# Patient Record
Sex: Male | Born: 1975 | Race: White | Hispanic: No | Marital: Single | Smoking: Current every day smoker
Health system: Southern US, Community
[De-identification: ages and names within clinical notes are randomized; demographics above are authoritative.]

## PROBLEM LIST (undated history)

## (undated) DIAGNOSIS — I10 Essential (primary) hypertension: Secondary | ICD-10-CM

## (undated) HISTORY — PX: HERNIA REPAIR: SHX51

---

## 1999-12-13 ENCOUNTER — Emergency Department (HOSPITAL_COMMUNITY): Admission: EM | Admit: 1999-12-13 | Discharge: 1999-12-13 | Payer: Self-pay | Admitting: Emergency Medicine

## 2000-09-30 ENCOUNTER — Emergency Department (HOSPITAL_COMMUNITY): Admission: EM | Admit: 2000-09-30 | Discharge: 2000-10-01 | Payer: Self-pay | Admitting: Emergency Medicine

## 2000-10-01 ENCOUNTER — Encounter: Payer: Self-pay | Admitting: Emergency Medicine

## 2000-10-16 ENCOUNTER — Encounter: Payer: Self-pay | Admitting: Chiropractor

## 2000-10-16 ENCOUNTER — Ambulatory Visit (HOSPITAL_COMMUNITY): Admission: RE | Admit: 2000-10-16 | Discharge: 2000-10-16 | Payer: Self-pay | Admitting: Chiropractor

## 2004-05-23 ENCOUNTER — Emergency Department (HOSPITAL_COMMUNITY): Admission: EM | Admit: 2004-05-23 | Discharge: 2004-05-23 | Payer: Self-pay | Admitting: Emergency Medicine

## 2015-02-11 ENCOUNTER — Encounter (HOSPITAL_COMMUNITY): Payer: Self-pay | Admitting: Emergency Medicine

## 2015-02-11 ENCOUNTER — Emergency Department (HOSPITAL_COMMUNITY)
Admission: EM | Admit: 2015-02-11 | Discharge: 2015-02-11 | Disposition: A | Discharge status: Home / Self Care | Payer: Self-pay | Attending: Emergency Medicine | Admitting: Emergency Medicine

## 2015-02-11 ENCOUNTER — Emergency Department (HOSPITAL_COMMUNITY): Payer: Self-pay

## 2015-02-11 DIAGNOSIS — Z720 Tobacco use: Secondary | ICD-10-CM | POA: Insufficient documentation

## 2015-02-11 DIAGNOSIS — L03818 Cellulitis of other sites: Secondary | ICD-10-CM

## 2015-02-11 DIAGNOSIS — Z7982 Long term (current) use of aspirin: Secondary | ICD-10-CM | POA: Insufficient documentation

## 2015-02-11 DIAGNOSIS — R079 Chest pain, unspecified: Secondary | ICD-10-CM | POA: Insufficient documentation

## 2015-02-11 DIAGNOSIS — L03312 Cellulitis of back [any part except buttock]: Secondary | ICD-10-CM | POA: Insufficient documentation

## 2015-02-11 LAB — URINALYSIS, ROUTINE W REFLEX MICROSCOPIC
Bilirubin Urine: NEGATIVE
Glucose, UA: NEGATIVE mg/dL
Hgb urine dipstick: NEGATIVE
Ketones, ur: NEGATIVE mg/dL
LEUKOCYTES UA: NEGATIVE
Nitrite: NEGATIVE
PROTEIN: NEGATIVE mg/dL
SPECIFIC GRAVITY, URINE: 1.016 (ref 1.005–1.030)
UROBILINOGEN UA: 1 mg/dL (ref 0.0–1.0)
pH: 5.5 (ref 5.0–8.0)

## 2015-02-11 LAB — COMPREHENSIVE METABOLIC PANEL
ALK PHOS: 74 U/L (ref 38–126)
ALT: 15 U/L — AB (ref 17–63)
ANION GAP: 14 (ref 5–15)
AST: 21 U/L (ref 15–41)
Albumin: 3.7 g/dL (ref 3.5–5.0)
BUN: 11 mg/dL (ref 6–20)
CALCIUM: 9.1 mg/dL (ref 8.9–10.3)
CHLORIDE: 99 mmol/L — AB (ref 101–111)
CO2: 22 mmol/L (ref 22–32)
CREATININE: 1.11 mg/dL (ref 0.61–1.24)
Glucose, Bld: 107 mg/dL — ABNORMAL HIGH (ref 65–99)
Potassium: 3.9 mmol/L (ref 3.5–5.1)
SODIUM: 135 mmol/L (ref 135–145)
Total Bilirubin: 0.6 mg/dL (ref 0.3–1.2)
Total Protein: 6.9 g/dL (ref 6.5–8.1)

## 2015-02-11 LAB — I-STAT TROPONIN, ED: TROPONIN I, POC: 0 ng/mL (ref 0.00–0.08)

## 2015-02-11 LAB — CBC
HEMATOCRIT: 41.2 % (ref 39.0–52.0)
Hemoglobin: 14.6 g/dL (ref 13.0–17.0)
MCH: 34.2 pg — ABNORMAL HIGH (ref 26.0–34.0)
MCHC: 35.4 g/dL (ref 30.0–36.0)
MCV: 96.5 fL (ref 78.0–100.0)
PLATELETS: 238 10*3/uL (ref 150–400)
RBC: 4.27 MIL/uL (ref 4.22–5.81)
RDW: 12.4 % (ref 11.5–15.5)
WBC: 10.9 10*3/uL — ABNORMAL HIGH (ref 4.0–10.5)

## 2015-02-11 MED ORDER — LIDOCAINE-EPINEPHRINE 1 %-1:100000 IJ SOLN
10.0000 mL | Freq: Once | INTRAMUSCULAR | Status: AC
  Administered 2015-02-11: 10 mL via INTRADERMAL
  Filled 2015-02-11: qty 1

## 2015-02-11 MED ORDER — SULFAMETHOXAZOLE-TRIMETHOPRIM 800-160 MG PO TABS: 1.0000 | ORAL_TABLET | Freq: Two times a day (BID) | ORAL | Status: AC

## 2015-02-11 NOTE — Discharge Instructions (Signed)
Please take your antibiotics as directed. Take all of your antibiotics, do not save or share them. Please follow-up with your doctor for reevaluation as needed. Return to ED for worsening symptoms.  Cellulitis Cellulitis is an infection of the skin and the tissue beneath it. The infected area is usually red and tender. Cellulitis occurs most often in the arms and lower legs.  CAUSES  Cellulitis is caused by bacteria that enter the skin through cracks or cuts in the skin. The most common types of bacteria that cause cellulitis are staphylococci and streptococci. SIGNS AND SYMPTOMS   Redness and warmth.  Swelling.  Tenderness or pain.  Fever. DIAGNOSIS  Your health care provider can usually determine what is wrong based on a physical exam. Blood tests may also be done. TREATMENT  Treatment usually involves taking an antibiotic medicine. HOME CARE INSTRUCTIONS   Take your antibiotic medicine as directed by your health care provider. Finish the antibiotic even if you start to feel better.  Keep the infected arm or leg elevated to reduce swelling.  Apply a warm cloth to the affected area up to 4 times per day to relieve pain.  Take medicines only as directed by your health care provider.  Keep all follow-up visits as directed by your health care provider. SEEK MEDICAL CARE IF:   You notice red streaks coming from the infected area.  Your red area gets larger or turns dark in color.  Your bone or joint underneath the infected area becomes painful after the skin has healed.  Your infection returns in the same area or another area.  You notice a swollen bump in the infected area.  You develop new symptoms.  You have a fever. SEEK IMMEDIATE MEDICAL CARE IF:   You feel very sleepy.  You develop vomiting or diarrhea.  You have a general ill feeling (malaise) with muscle aches and pains. MAKE SURE YOU:   Understand these instructions.  Will watch your condition.  Will  get help right away if you are not doing well or get worse. Document Released: 03/19/2005 Document Revised: 10/24/2013 Document Reviewed: 08/25/2011 [facility] Patient Information 2015 [place], [place]. This information is not intended to replace advice given to you by your health care provider. Make sure you discuss any questions you have with your health care provider.  Community-Associated MRSA CA-MRSA stands for community-associated methicillin-resistant Staphylococcus aureus. MRSA is a type of bacteria that is resistant to some common antibiotics. It can cause infections in the skin and many other places in the body. Staphylococcus aureus, often called "staph," is a bacteria that normally lives on the skin or in the nose. Staph on the surface of the skin or in the nose does not cause problems. However, if the staph enters the body through a cut, wound, or break in the skin, an infection can happen. Up until recently, infections with the MRSA type of staph mainly occurred in hospitals and other health care settings. There are now increasing problems with MRSA infections in the community as well. Infections with MRSA may be very serious or even life threatening. CA-MRSA is becoming more common. It is known to spread in crowded settings, in jails and prisons, and in situations where there is close skin-to-skin contact, such as during sporting events or in locker rooms. MRSA can be spread through shared items, such as children's toys, razors, towels, or sports equipment.  CAUSES All staph, including MRSA, are normally harmless unless they enter the body through a scratch, cut,  or wound, such as with surgery. All staph, including MRSA, can be spread from person-to-person by touching contaminated objects or through direct contact.  MRSA now causes illness in people who have not been in hospitals or other health care facilities. Cases of MRSA diseases in the community have been associated with:  Recent  antibiotic use.  Sharing contaminated towels or clothes.  Having active skin diseases.  Participating in contact sports.  Living in crowded settings.  Intravenous (IV) drug use.  Community-associated MRSA infections are usually skin infections, but may cause other severe illnesses.  Staph bacteria are one of the most common causes of skin infection. However, they are also a common cause of pneumonia, bone or joint infections, and bloodstream infections. DIAGNOSIS Diagnosis of MRSA is done by cultures of fluid samples that may come from:  Swabs taken from cuts or wounds in infected areas.  Nasal swabs.  Saliva or deep cough specimens from the lungs (sputum).  Urine.  Blood. Many people are "colonized" with MRSA but have no signs of infection. This means that people carry the MRSA germ on their skin or in their nose and may never develop MRSA infection.  TREATMENT  Treatment varies and is based on how serious, how deep, or how extensive the infection is. For example:  Some skin infections, such as a small boil or abscess, may be treated by draining yellowish-white fluid (pus) from the site of the infection.  Deeper or more widespread soft tissue infections are usually treated with surgery to drain pus and with antibiotic medicine given by vein or by mouth. This may be recommended even if you are pregnant.  Serious infections may require a hospital stay. If antibiotics are given, they may be needed for several weeks. PREVENTION Because many people are colonized with staph, including MRSA, preventing the spread of the bacteria from person-to-person is most important. The best way to prevent the spread of bacteria and other germs is through proper hand washing or by using alcohol-based hand disinfectants. The following are other ways to help prevent MRSA infection within community settings.   Wash your hands frequently with soap and water for at least 15 seconds. Otherwise, use  alcohol-based hand disinfectants when soap and water is not available.  Make sure people who live with you wash their hands often, too.  Do not share personal items. For example, avoid sharing razors and other personal hygiene items, towels, clothing, and athletic equipment.  Wash and dry your clothes and bedding at the warmest temperatures recommended on the labels.  Keep wounds covered. Pus from infected sores may contain MRSA and other bacteria. Keep cuts and abrasions clean and covered with germ-free (sterile), dry bandages until they are healed.  If you have a wound that appears infected, ask your caregiver if a culture for MRSA and other bacteria should be done.  If you are breastfeeding, talk to your caregiver about MRSA. You may be asked to temporarily stop breastfeeding. HOME CARE INSTRUCTIONS   Take your antibiotics as directed. Finish them even if you start to feel better.  Avoid close contact with those around you as much as possible. Do not use towels, razors, toothbrushes, bedding, or other items that will be used by others.  To fight the infection, follow your caregiver's instructions for wound care. Wash your hands before and after changing your bandages.  If you have an intravascular device, such as a catheter, make sure you know how to care for it.  Be sure to  tell any health care providers that you have MRSA so they are aware of your infection. SEEK IMMEDIATE MEDICAL CARE IF:  The infection appears to be getting worse. Signs include:  Increased warmth, redness, or tenderness around the wound site.  A red line that extends from the infection site.  A dark color in the area around the infection.  Wound drainage that is tan, yellow, or green.  A bad smell coming from the wound.  You feel sick to your stomach (nauseous) and throw up (vomit) or cannot keep medicine down.  You have a fever.  Your baby is older than 3 months with a rectal temperature of 102F  (38.9C) or higher.  Your baby is 77 months old or younger with a rectal temperature of 100.31F (38C) or higher.  You have difficulty breathing. MAKE SURE YOU:   Understand these instructions.  Will watch your condition.  Will get help right away if you are not doing well or get worse. Document Released: 09/12/2005 Document Revised: 10/24/2013 Document Reviewed: 09/12/2010 [facility] Patient Information 2015 [place], [place]. This information is not intended to replace advice given to you by your health care provider. Make sure you discuss any questions you have with your health care provider.

## 2015-02-11 NOTE — ED Provider Notes (Signed)
CSN: [id number]     Arrival date & time 02/11/15  0204 History   First MD Initiated Contact with Patient 02/11/15 [phone]     Chief Complaint  Patient presents with  . Chest Pain  . Abscess     (Consider location/radiation/quality/duration/timing/severity/associated sxs/prior Treatment) HPI Vincent Price is a 39 y.o. male who comes in for evaluation of chest pain and abscess. Patient states about a week ago he developed a pimple on his lower back but never came to a head. He reports the area just became more swollen and red and tender. He said he tried to pop it, but this did not work. He reports a slight fever home of 100F, resolved PTA. He also reports last night at approximately 11:30 PM, being awoken by a sharp, fleeting chest pain. Denies any associated shortness of breath, nausea or vomiting, but does report he was sweaty. He denies any illicit substance abuse, specifically cocaine and heroin. No other aggravating or modifying factors.  History reviewed. No pertinent past medical history. Past Surgical History  Procedure Laterality Date  . Hernia repair     History reviewed. No pertinent family history. Social History  Substance Use Topics  . Smoking status: Current Every Day Smoker  . Smokeless tobacco: None  . Alcohol Use: Yes    Review of Systems  A 10 point review of systems was completed and was negative except for pertinent positives and negatives as mentioned in the history of present illness    Allergies  Review of patient's allergies indicates no known allergies.  Home Medications   Prior to Admission medications   Medication Sig Start Date End Date Taking? Authorizing Provider  Aspirin-Salicylamide-Caffeine (BC HEADACHE POWDER PO) Take 1 packet by mouth 2 (two) times daily as needed (pain).   Yes Historical Provider, MD  sulfamethoxazole-trimethoprim (BACTRIM DS,SEPTRA DS) 800-160 MG per tablet Take 1 tablet by mouth 2 (two) times daily. 02/11/15 02/18/15   Joycie Peek, PA-C   BP 134/88 mmHg  Pulse 96  Temp(Src) 98.7 F (37.1 C) (Oral)  Resp 21  Ht  (1.854 m)  Wt 175 lb 5 oz (79.521 kg)  BMI 23.13 kg/m2  SpO2 98% Physical Exam  Constitutional: He is oriented to person, place, and time. He appears well-developed and well-nourished.  HENT:  Head: Normocephalic and atraumatic.  Mouth/Throat: Oropharynx is clear and moist.  Eyes: Conjunctivae are normal. Pupils are equal, round, and reactive to light. Right eye exhibits no discharge. Left eye exhibits no discharge. No scleral icterus.  Neck: Neck supple.  Cardiovascular: Normal rate, regular rhythm and normal heart sounds.   On exam, heart rate 95 with normal heart sounds.  Pulmonary/Chest: Effort normal and breath sounds normal. No respiratory distress. He has no wheezes. He has no rales.  Abdominal: Soft. There is no tenderness.  Musculoskeletal: He exhibits no tenderness.  Area over top of the L-spine with approximately 3 cm area of erythema, induration  Neurological: He is alert and oriented to person, place, and time.  Cranial Nerves II-XII grossly intact. Moves all extremities without ataxia. Motor and sensation baseline. Gait baseline.  Skin: Skin is warm and dry. No rash noted.  Psychiatric: He has a normal mood and affect.  Nursing note and vitals reviewed.   ED Course  Procedures (including critical care time) Labs Review Labs Reviewed  COMPREHENSIVE METABOLIC PANEL - Abnormal; Notable for the following:    Chloride 99 (*)    Glucose, Bld 107 (*)    ALT 15 (*)  All other components within normal limits  CBC - Abnormal; Notable for the following:    WBC 10.9 (*)    MCH 34.2 (*)    All other components within normal limits  CULTURE, BLOOD (ROUTINE X 2)  CULTURE, BLOOD (ROUTINE X 2)  URINE CULTURE  URINALYSIS, ROUTINE W REFLEX MICROSCOPIC (NOT AT [facility])  Rosezena Sensor, ED    Imaging Review Dg Chest 2 View  02/11/2015   CLINICAL DATA:  Shortness of  breath.  Chest pain.  EXAM: CHEST  2 VIEW  COMPARISON:  None currently available  FINDINGS: Normal heart size and mediastinal contours. No acute infiltrate or edema. No effusion or pneumothorax. No acute osseous findings.  IMPRESSION: No active cardiopulmonary disease.   Electronically Signed   By: Marnee Spring M.D.   On: 02/11/2015 03:37   I have personally reviewed and evaluated these images and lab results as part of my medical decision-making.   EKG Interpretation   Date/Time:  Sunday February 11 2015 02:06:03 EDT Ventricular Rate:  102 PR Interval:  124 QRS Duration: 96 QT Interval:  350 QTC Calculation: 456 R Axis:   56 Text Interpretation:  Sinus tachycardia with occasional Premature  ventricular complexes Otherwise normal ECG No old tracing to compare  Confirmed by Erroll Luna [phone]) on 02/11/2015 5:30:58 AM     Meds given in ED:  Medications  lidocaine-EPINEPHrine (XYLOCAINE W/EPI) 1 %-1:100000 (with pres) injection 10 mL (10 mLs Intradermal Given by Other 02/11/15 0557)    Discharge Medication List as of 02/11/2015  7:51 AM    START taking these medications   Details  sulfamethoxazole-trimethoprim (BACTRIM DS,SEPTRA DS) 800-160 MG per tablet Take 1 tablet by mouth 2 (two) times daily., Starting 02/11/2015, Until Sun 02/18/15, Print       Filed Vitals:   02/11/15 0208 02/11/15 0518 02/11/15 0733  BP: 137/78 130/83 134/88  Pulse: 102 102 96  Temp: 99 F (37.2 C) 98.7 F (37.1 C)   TempSrc: Oral Oral   Resp: 16 16 21   Height: 6\' 1"  (1.854 m)    Weight: 175 lb 5 oz (79.521 kg)    SpO2: 98% 99% 98%    MDM  Vitals stable - WNL -afebrile Pt resting comfortably in ED. PE--physical exam shows evidence of cellulitic area to lower lumbar spine. Normal neuro exam. Gait is baseline Labwork--labs show white count of 10.9, labs are otherwise noncontributory. Troponin negative and EKG is reassuring. Imaging--chest x-ray shows no evidence of cardiopulmonary  pathology.  DDX--low suspicion for epidural abscess or other spinal cord pathology. Chest pain is atypical and not consistent with ACS, dissection. Low suspicion for PE. No evidence of pneumothorax, mediastinitis. Patient denies any discomfort now. Will DC with Bactrim for cellulitis. I discussed all relevant lab findings and imaging results with pt and they verbalized understanding. I personally reviewed the imaging and agree with the results as interpreted by the radiologist.  Discussed f/u with PCP within 48 hrs and return precautions, pt very amenable to plan. Prior to patient discharge, I discussed and reviewed this case with Dr. Mora Bellman, who also saw and evaluated the patient.  Final diagnoses:  Cellulitis of other specified site        Joycie Peek, PA-C 02/11/15 6045  Tomasita Crumble, MD 02/11/15 1635

## 2015-02-11 NOTE — ED Notes (Addendum)
Pt tolerated oral well.  Ready to leave. Cellulitic area on mid back marked with permanent marker.

## 2015-02-11 NOTE — ED Notes (Signed)
Patient here with complaint of back pain. Has an abscess in middle of his back. Reports fever and pain. Area is raised, red, and warm. No drainage noted. Significant induration present. Presents tonight because pain became worse. Also began to have chest pain and shortness of breath which woke him from sleep tonight.

## 2015-02-12 LAB — URINE CULTURE: CULTURE: NO GROWTH

## 2015-02-16 LAB — CULTURE, BLOOD (ROUTINE X 2)
CULTURE: NO GROWTH
Culture: NO GROWTH

## 2015-03-30 ENCOUNTER — Encounter (HOSPITAL_COMMUNITY): Payer: Self-pay | Admitting: Emergency Medicine

## 2015-03-30 ENCOUNTER — Emergency Department (HOSPITAL_COMMUNITY)
Admission: EM | Admit: 2015-03-30 | Discharge: 2015-03-30 | Disposition: A | Discharge status: Home / Self Care | Payer: Self-pay | Attending: Emergency Medicine | Admitting: Emergency Medicine

## 2015-03-30 DIAGNOSIS — L02416 Cutaneous abscess of left lower limb: Secondary | ICD-10-CM | POA: Insufficient documentation

## 2015-03-30 DIAGNOSIS — Z720 Tobacco use: Secondary | ICD-10-CM | POA: Insufficient documentation

## 2015-03-30 DIAGNOSIS — L02415 Cutaneous abscess of right lower limb: Secondary | ICD-10-CM | POA: Insufficient documentation

## 2015-03-30 LAB — CBG MONITORING, ED: GLUCOSE-CAPILLARY: 122 mg/dL — AB (ref 65–99)

## 2015-03-30 MED ORDER — SULFAMETHOXAZOLE-TRIMETHOPRIM 800-160 MG PO TABS
1.0000 | ORAL_TABLET | Freq: Once | ORAL | Status: AC
  Administered 2015-03-30: 1 via ORAL
  Filled 2015-03-30: qty 1

## 2015-03-30 MED ORDER — LIDOCAINE-EPINEPHRINE (PF) 2 %-1:200000 IJ SOLN
40.0000 mL | Freq: Once | INTRAMUSCULAR | Status: AC
  Administered 2015-03-30: 40 mL
  Filled 2015-03-30: qty 40

## 2015-03-30 MED ORDER — SULFAMETHOXAZOLE-TRIMETHOPRIM 800-160 MG PO TABS: 2.0000 | ORAL_TABLET | Freq: Two times a day (BID) | ORAL | Status: DC

## 2015-03-30 NOTE — ED Notes (Signed)
PA at bedside.

## 2015-03-30 NOTE — ED Provider Notes (Signed)
CSN: [id number]     Arrival date & time 03/30/15  1446 History  By signing my name below, I, Budd Palmer, attest that this documentation has been prepared under the direction and in the presence of [employer], PA-C. Electronically Signed: Budd Palmer, ED Scribe. 03/30/2015. 4:50 PM.    Chief Complaint  Patient presents with  . Abscess    legs   The history is provided by the patient. No language interpreter was used.   HPI Comments: Vincent Price is a 39 y.o. male smoker who presents to the Emergency Department complaining of three painful, worsening abscesses on both lower extremities onset 5 days ago. He notes one abscess on the right upper calf, one above the left knee, and one on the left calf. Pt states they started out looking like zits and then progressively grew larger. He reports associated mild subjective fever. He denies swimming in dirty water, as well as a PMHx of DM. He states he does not currently have a PCP. Pt has NKDA.  History reviewed. No pertinent past medical history. Past Surgical History  Procedure Laterality Date  . Hernia repair     History reviewed. No pertinent family history. Social History  Substance Use Topics  . Smoking status: Current Every Day Smoker  . Smokeless tobacco: None  . Alcohol Use: Yes    Review of Systems A complete 10 system review of systems was obtained and all systems are negative except as noted in the HPI and PMH.   Allergies  Review of patient's allergies indicates no known allergies.  Home Medications   Prior to Admission medications   Medication Sig Start Date End Date Taking? Authorizing Provider  Aspirin-Salicylamide-Caffeine (BC HEADACHE POWDER PO) Take 1 packet by mouth 2 (two) times daily as needed (pain).    Historical Provider, MD   BP 161/93 mmHg  Pulse 104  Temp(Src) 98.9 F (37.2 C) (Oral)  Resp 18  Ht 6\' 1"  (1.854 m)  Wt 175 lb (79.379 kg)  BMI 23.09 kg/m2  SpO2 100% Physical Exam    Constitutional: He is oriented to person, place, and time. He appears well-developed and well-nourished.  HENT:  Head: Normocephalic.  Eyes: EOM are normal.  Neck: Normal range of motion.  Pulmonary/Chest: Effort normal.  Abdominal: He exhibits no distension.  Musculoskeletal: Normal range of motion.  Neurological: He is alert and oriented to person, place, and time.  Skin:     Pt has 2-3cm fluctuant, draining abscesses to the lower extremities  Psychiatric: He has a normal mood and affect.  Nursing note and vitals reviewed.   ED Course  Procedures  DIAGNOSTIC STUDIES: Oxygen Saturation is 100% on RA, normal by my interpretation.    COORDINATION OF CARE: 4:06 PM - Discussed plans to I&D the abscesses and to order diagnostic studies to r/o DM. Pt advised of plan for treatment and pt agrees.  INCISION AND DRAINAGE PROCEDURE NOTE: Patient identification was confirmed and verbal consent was obtained. This procedure was performed by Wynetta Emery, PA-C at 4:30 PM. Site: right calf Sterile procedures observed Anesthetic used (type and amt): Xylocaine w/Epi 2%, 40 mL injection Blade size: 111 Drainage: Moderate amount of purulent drainage Complexity: Complex Packing used: None Site anesthetized, incision made over site, wound drained and explored loculations, rinsed with copious amounts of normal saline, wound packed with sterile gauze, covered with dry, sterile dressing.  Pt tolerated procedure well without complications.  Instructions for care discussed verbally and pt provided with additional written instructions  for homecare and f/u.  INCISION AND DRAINAGE PROCEDURE NOTE: Patient identification was confirmed and verbal consent was obtained. This procedure was performed by Wynetta Emery, PA-C at 4:30 PM. Site: left thigh Sterile procedures observed Anesthetic used (type and amt): Xylocaine w/Epi 2%, 40 mL injection Blade size: 11 Drainage: Scant amount of bloody  and purulent drainage Complexity: Complex Packing used: none Site anesthetized, incision made over site, wound drained and explored loculations, rinsed with copious amounts of normal saline, wound packed with sterile gauze, covered with dry, sterile dressing.  Pt tolerated procedure well without complications.  Instructions for care discussed verbally and pt provided with additional written instructions for homecare and f/u.  INCISION AND DRAINAGE PROCEDURE NOTE: Patient identification was confirmed and verbal consent was obtained. This procedure was performed by Wynetta Emery, PA-C at 4:30 PM. Site: left calf Sterile procedures observed Anesthetic used (type and amt): Xylocaine w/Epi 2%, 40 mL injection Blade size: 11 Drainage: Moderate amount of bloody and purulent drainage Complexity: Complex Packing used: None Site anesthetized, incision made over site, wound drained and explored loculations, rinsed with copious amounts of normal saline, wound packed with sterile gauze, covered with dry, sterile dressing.  Pt tolerated procedure well without complications.  Instructions for care discussed verbally and pt provided with additional written instructions for homecare and f/u.  Labs Review Labs Reviewed  CBG MONITORING, ED - Abnormal; Notable for the following:    Glucose-Capillary 122 (*)    All other components within normal limits    Imaging Review No results found. I have personally reviewed and evaluated these lab results as part of my medical decision-making.   EKG Interpretation None      MDM   Final diagnoses:  Multiple abscesses of both legs    Filed Vitals:   03/30/15 1457 03/30/15 1715  BP: 161/93 146/91  Pulse: 104 93  Temp: 98.9 F (37.2 C)   TempSrc: Oral   Resp: 18 18  Height:  (1.854 m)   Weight: 175 lb (79.379 kg)   SpO2: 100% 100%    Medications  lidocaine-EPINEPHrine (XYLOCAINE W/EPI) 2 %-1:200000 (PF) injection 40 mL (40 mLs  Infiltration Given 03/30/15 1649)  sulfamethoxazole-trimethoprim (BACTRIM DS,SEPTRA DS) 800-160 MG per tablet 1 tablet (1 tablet Oral Given 03/30/15 1707)    Vincent Price is a pleasant 39 y.o. male presenting with multiple abscesses to lower extremities, patient states that he was aching at 7 lesions on the leg several days ago and they have gotten progressively worse. No signs of systemic infection, patient denies diabetes but doesn't have a primary care physician, his CBG in the ED is normal. IND is performed at the bedside and patient started on Bactrim considering the multiple nature of his abscesses, there is no significant surrounding cellulitis.  Evaluation does not show pathology that would require ongoing emergent intervention or inpatient treatment. Pt is hemodynamically stable and mentating appropriately. Discussed findings and plan with patient/guardian, who agrees with care plan. All questions answered. Return precautions discussed and outpatient follow up given.   Discharge Medication List as of 03/30/2015  5:11 PM    START taking these medications   Details  sulfamethoxazole-trimethoprim (BACTRIM DS) 800-160 MG tablet Take 2 tablets by mouth 2 (two) times daily., Starting 03/30/2015, Until Discontinued, Print        I personally performed the services described in this documentation, which was scribed in my presence. The recorded information has been reviewed and is accurate.   Wynetta Emery, PA-C 03/31/15 1102  Leta Baptist, MD 03/31/15 2219

## 2015-03-30 NOTE — Discharge Instructions (Signed)
If you see worsening signs of infection (warmth, redness, tenderness, pus, sharp increase in pain, fever, red streaking) immediately return to the emergency department.  Do not hesitate to return to the emergency room for any new, worsening or concerning symptoms.  Please obtain primary care using resource guide below. Let them know that you were seen in the emergency room and that they will need to obtain records for further outpatient management.    Emergency Department Resource Guide 1) Find a Doctor and Pay Out of Pocket Although you won't have to find out who is covered by your insurance plan, it is a good idea to ask around and get recommendations. You will then need to call the office and see if the doctor you have chosen will accept you as a new patient and what types of options they offer for patients who are self-pay. Some doctors offer discounts or will set up payment plans for their patients who do not have insurance, but you will need to ask so you aren't surprised when you get to your appointment.  2) Contact Your Local Health Department Not all health departments have doctors that can see patients for sick visits, but many do, so it is worth a call to see if yours does. If you don't know where your local health department is, you can check in your phone book. The CDC also has a tool to help you locate your state's health department, and many state websites also have listings of all of their local health departments.  3) Find a Walk-in Clinic If your illness is not likely to be very severe or complicated, you may want to try a walk in clinic. These are popping up all over the country in pharmacies, drugstores, and shopping centers. They're usually staffed by nurse practitioners or physician assistants that have been trained to treat common illnesses and complaints. They're usually fairly quick and inexpensive. However, if you have serious medical issues or chronic medical problems,  these are probably not your best option.  No Primary Care Doctor: - Call Health Connect at  [phone] - they can help you locate a primary care doctor that  accepts your insurance, provides certain services, etc. - Physician Referral Service- [phone]  Chronic Pain Problems: Organization         Address  Phone   Notes  [facility]  [phone] Patients need to be referred by their primary care doctor.   Medication Assistance: Organization         Address  Phone   Notes  [facility] Medication [facility] [address] [place] [place], [place] [address] [phone] --Must be a resident of [facility] -- Must have NO insurance coverage whatsoever (no Medicaid/ Medicare, etc.) -- The pt. MUST have a primary care doctor that directs their care regularly and follows them in the community   Med[facility]ist  [phone]   [employer]  [phone]    Agencies that provide inexpensive medical care: Organization         Address  Phone   Notes  [facility]  [phone]   [facility]    [phone]   [facility] [address] [place], [place] [address] [phone]   [facility] [address] [place] [place] [phone]   Planned Parenthood    [phone]   [facility]    [phone]  [facility] [place]  [address], [place] Phone:  [phone], Fax:  [phone] Hours of Operation:  9 am - 6 pm, M-F.  Also accepts Medicaid/Medicare and self-pay.  [facility] [place], [address], [place] Phone: [phone], Fax: [phone]. Hours of Operation:  8:30 am - 5:30 pm, M-F.  Also accepts Medicaid and self-pay.  [facility] [address] [place] Phone: [phone]   [place], [place], [place] [phone], Ext. [phone] Mondays &  Thursdays: 7-9 AM.  First 15 patients are seen on a first come, first serve basis.    [place] Providers:  Organization         Address  Phone   Notes  [facility] [address], [place] [phone] Also accepts self-pay patients.  [facility] [address], [place]  [phone]   [place], [address], [place] [phone]   [facility] [address], [place] [phone]   Lucianne Lei [address], [place]   [phone] Only accepts [place] Access [place] patients after they have their name applied to their card.   Self-Pay (no insurance) in [facility]:  Organization         Address  Phone   Notes  Sickle Cell Patients, [facility] [place] [phone]   [facility] [place] [phone]   [facility] [place]  [place], [place], [place] [phone]   [facility]/Dr. Osei-Bonsu  [address] [place] or [address], [place] [phone] Phone number for both [place] and [place] locations is the same.  [facility] [address] [place] [phone]   [facility] [address] [place] or [address] [phone]   [facility] [address] [place] [phone], phone; [phone], fax Sees patients 1st and 3rd Saturday of every month.  Must not qualify for public or private insurance (i.e. Medicaid, Medicare, [place] Health Choice, Veterans' Benefits)  Household income should be no more than 200% of the poverty level The clinic cannot treat you if you are pregnant or think you are pregnant  Sexually transmitted diseases are not treated at the  clinic.    Dental Care: Organization         Address  Phone  Notes  [facility] Department of [facility] [place] [phone] Accepts children up to age 19 who are enrolled in [place] or [place]; pregnant women with a Medicaid card; and children who have applied for Medicaid or [place] Health Choice, but were declined, whose parents can pay a reduced fee at time of service.  [facility] Department of [facility]  [address], [place] [phone] Accepts children up to age 62 who are enrolled in [place] or [place]; pregnant women with a Medicaid card; and children who have applied for Medicaid or [place] Health Choice, but were declined, whose parents can pay a reduced fee at time of service.  [place]  [phone]  [place] [phone] Patients are seen by appointment only. Walk-ins are not accepted. [place] will see patients 46 years of age and older. Monday - Tuesday (8am-5pm) Most Wednesdays (8:30-5pm) $30 per visit, cash only  [facility] Adult Dental Access PROGRAM  [address], [facility] [phone] Patients are seen by appointment only. Walk-ins are not accepted. [place] will see patients 10 years of age and older. One Wednesday Evening (Monthly: Volunteer Based).  $30 per visit, cash only  [place]  [phone] for adults; Children under age 30, call Graduate Pediatric Dentistry at [phone]. Children aged 7-14, please call [phone] to request a pediatric application.  Dental services are provided in all areas of dental care including fillings, crowns and bridges, complete and partial dentures, implants, gum treatment, root canals, and extractions. Preventive care is also provided. Treatment is provided to both adults and children. Patients are selected via a lottery and there is often a  waiting list.   [facility] [address], [place]  [phone] [email or web address]   [facility] [address] [place], [place] [phone], Ext. [phone] Second and Fourth Thursday of each month, opens at 6:30 AM; Clinic ends at 9 AM.  Patients are seen on a first-come first-served basis, and a limited number are seen during each clinic.   [facility]  [address] Hillard Danker [place], [place] [phone]   Eligibility Requirements You must have lived in [place], [place], or [place] counties for at least the last three months.   You cannot be eligible for state or federal sponsored [employer], including [employer], [place], or [employer].   You generally cannot be eligible for healthcare insurance through your employer.    How to apply: Eligibility screenings are held every Tuesday and Wednesday afternoon from 1:00 pm until 4:00 pm. You do not need an appointment for the interview!  [facility] [address] [place], [place]   [place]  [place] Department  [place]  [phone]    Behavioral Health Resources in the Community: Intensive Outpatient Programs Organization         Address  Phone  Notes  [place] [place], [place] [phone]   [facility] [address] [place], [place]   [facility]: [facility] [address] [place], [place]   [place] [address]  [place], [place] or [phone]   Substance Abuse Resources Organization         Address  Phone  Notes  Alcohol and Drug Services  [phone]   [place]  [phone]   [facility]   Chinita Pester  ([phone]   Residential & Outpatient Substance Abuse  Program  [phone]   Psychological Services Organization         Address  Phone  Notes  [facility]  [place]  [phone]   [place] [address], [place] or [phone]    Mobile Crisis Teams Organization         Address  Phone  Notes  [facility], Mobile Crisis Care Unit  [phone]   [facility]  [address] [place], [place]  [facility] [place] [facility] [address] [place] [phone]    Self-Help/Support Groups Organization         Address  Phone             Notes  [facility] [place] - variety of support groups  336- I7437963 Call for more information  Narcotics Anonymous (NA), [facility] [address], [employer] [place]  2 meetings at this location   Statistician         Address  Phone  Notes  [facility] [address],    [place]  [phone]   [facility]  [address], [place] [address], [place], [place] [phone]   [facility] [address] [place], [place] [phone] Admissions: 8am-3pm M-F  [facility] [address],    [place], [place] [phone]   [facility] [address] [place], [place], [place] [phone]   [facility] [address],  [place], [place] [phone]   [facility] - Intensive Outpatient [address]ure[address], [place], [place] [phone]   [facility] (Addiction Recovery Care [facility] [address]  [place], [place] [phone] or [phone]   [facility] ([facility]) [address], [place], [place] [phone] Accepts Medicaid  [facility] [address]  [place] [phone] Substance Abuse/Addiction Treatment   [facility] Organization          Address  Phone  Notes  [facility]  [phone]   Angie Fava, PhD [address] Ervin Knack [place], [place]   [phone] or [phone]   [facility] Behavioral   [address] [place], [place] [phone]   [facility] [address] [place], [place] [phone] Insurance/Medicaid/sponsorship through [facility] [address]                                    [place], [place] [phone] Therapy/tele-psych/case  [facility] [address], [place] [phone]    Dr. Lolly Mustache  [phone]   [facility]  [employer] [facility] Dept. 1) [address], [place] 2) [address] [place] 3)  [address], [place] [phone]  [phone]   [facility] Child Abuse Hotline (513) 793-9407 or (872) [phone] (After Hours)         Abscess An abscess is an infected area that contains a collection of pus and debris.It can occur in almost any part of the body. An abscess is also known as a furuncle or boil. CAUSES  An abscess occurs when tissue gets infected. This can occur from blockage of oil or sweat glands, infection of hair follicles, or a minor injury to the skin. As the body tries to fight the infection, pus collects in the area and creates pressure under the skin. This pressure causes pain. People with weakened immune systems have difficulty fighting infections and get certain abscesses more often.  SYMPTOMS Usually an abscess develops on the skin and becomes a painful mass that is red, warm, and tender. If the abscess forms under the skin, you may feel a moveable soft area under the skin. Some abscesses break open (rupture) on their own, but most will continue to  get worse without care. The infection can spread deeper into the body and eventually into the bloodstream, causing you to feel ill.  DIAGNOSIS  Your caregiver will take your medical  history and perform a physical exam. A sample of fluid may also be taken from the abscess to determine what is causing your infection. TREATMENT  Your caregiver may prescribe antibiotic medicines to fight the infection. However, taking antibiotics alone usually does not cure an abscess. Your caregiver may need to make a small cut (incision) in the abscess to drain the pus. In some cases, gauze is packed into the abscess to reduce pain and to continue draining the area. HOME CARE INSTRUCTIONS   Only take over-the-counter or prescription medicines for pain, discomfort, or fever as directed by your caregiver.  If you were prescribed antibiotics, take them as directed. Finish them even if you start to feel better.  If gauze is used, follow your caregiver's directions for changing the gauze.  To avoid spreading the infection:  Keep your draining abscess covered with a bandage.  Wash your hands well.  Do not share personal care items, towels, or whirlpools with others.  Avoid skin contact with others.  Keep your skin and clothes clean around the abscess.  Keep all follow-up appointments as directed by your caregiver. SEEK MEDICAL CARE IF:   You have increased pain, swelling, redness, fluid drainage, or bleeding.  You have muscle aches, chills, or a general ill feeling.  You have a fever. MAKE SURE YOU:   Understand these instructions.  Will watch your condition.  Will get help right away if you are not doing well or get worse.   This information is not intended to replace advice given to you by your health care provider. Make sure you discuss any questions you have with your health care provider.   Document Released: 03/19/2005 Document Revised: 12/09/2011 Document Reviewed: 08/22/2011 Elsevier Interactive Patient Education [employer]

## 2015-03-30 NOTE — ED Notes (Signed)
Pt picked at some zits on his leg 5 days ago. Wound became bigger over the past few days. Pt has one abscess on R lower leg, one below the L knee and one above the L knee.

## 2016-05-29 ENCOUNTER — Observation Stay (HOSPITAL_COMMUNITY)
Admission: EM | Admit: 2016-05-29 | Discharge: 2016-05-30 | Disposition: A | Discharge status: Home / Self Care | Payer: Self-pay | Attending: Internal Medicine | Admitting: Internal Medicine

## 2016-05-29 ENCOUNTER — Observation Stay (HOSPITAL_COMMUNITY): Payer: Self-pay

## 2016-05-29 ENCOUNTER — Emergency Department (HOSPITAL_COMMUNITY): Payer: Self-pay

## 2016-05-29 ENCOUNTER — Encounter (HOSPITAL_COMMUNITY): Payer: Self-pay | Admitting: Emergency Medicine

## 2016-05-29 DIAGNOSIS — R740 Nonspecific elevation of levels of transaminase and lactic acid dehydrogenase [LDH]: Secondary | ICD-10-CM | POA: Insufficient documentation

## 2016-05-29 DIAGNOSIS — Z79899 Other long term (current) drug therapy: Secondary | ICD-10-CM | POA: Insufficient documentation

## 2016-05-29 DIAGNOSIS — F17210 Nicotine dependence, cigarettes, uncomplicated: Secondary | ICD-10-CM | POA: Insufficient documentation

## 2016-05-29 DIAGNOSIS — D7589 Other specified diseases of blood and blood-forming organs: Secondary | ICD-10-CM | POA: Insufficient documentation

## 2016-05-29 DIAGNOSIS — Z720 Tobacco use: Secondary | ICD-10-CM | POA: Diagnosis present

## 2016-05-29 DIAGNOSIS — F1110 Opioid abuse, uncomplicated: Secondary | ICD-10-CM | POA: Insufficient documentation

## 2016-05-29 DIAGNOSIS — K529 Noninfective gastroenteritis and colitis, unspecified: Secondary | ICD-10-CM | POA: Insufficient documentation

## 2016-05-29 DIAGNOSIS — I10 Essential (primary) hypertension: Secondary | ICD-10-CM | POA: Insufficient documentation

## 2016-05-29 DIAGNOSIS — F1010 Alcohol abuse, uncomplicated: Secondary | ICD-10-CM | POA: Insufficient documentation

## 2016-05-29 DIAGNOSIS — D696 Thrombocytopenia, unspecified: Secondary | ICD-10-CM | POA: Insufficient documentation

## 2016-05-29 DIAGNOSIS — K852 Alcohol induced acute pancreatitis without necrosis or infection: Principal | ICD-10-CM | POA: Insufficient documentation

## 2016-05-29 DIAGNOSIS — Z7982 Long term (current) use of aspirin: Secondary | ICD-10-CM | POA: Insufficient documentation

## 2016-05-29 DIAGNOSIS — R109 Unspecified abdominal pain: Secondary | ICD-10-CM

## 2016-05-29 DIAGNOSIS — K700 Alcoholic fatty liver: Secondary | ICD-10-CM | POA: Insufficient documentation

## 2016-05-29 DIAGNOSIS — R202 Paresthesia of skin: Secondary | ICD-10-CM

## 2016-05-29 DIAGNOSIS — K859 Acute pancreatitis without necrosis or infection, unspecified: Secondary | ICD-10-CM

## 2016-05-29 HISTORY — DX: Essential (primary) hypertension: I10

## 2016-05-29 LAB — CBC
HCT: 42.1 % (ref 39.0–52.0)
HEMOGLOBIN: 14.8 g/dL (ref 13.0–17.0)
MCH: 35.4 pg — AB (ref 26.0–34.0)
MCHC: 35.2 g/dL (ref 30.0–36.0)
MCV: 100.7 fL — ABNORMAL HIGH (ref 78.0–100.0)
PLATELETS: 126 10*3/uL — AB (ref 150–400)
RBC: 4.18 MIL/uL — AB (ref 4.22–5.81)
RDW: 14.2 % (ref 11.5–15.5)
WBC: 6 10*3/uL (ref 4.0–10.5)

## 2016-05-29 LAB — URINALYSIS, ROUTINE W REFLEX MICROSCOPIC
BILIRUBIN URINE: NEGATIVE
Glucose, UA: NEGATIVE mg/dL
HGB URINE DIPSTICK: NEGATIVE
Ketones, ur: NEGATIVE mg/dL
Leukocytes, UA: NEGATIVE
NITRITE: NEGATIVE
PROTEIN: NEGATIVE mg/dL
Specific Gravity, Urine: 1.038 — ABNORMAL HIGH (ref 1.005–1.030)
pH: 7 (ref 5.0–8.0)

## 2016-05-29 LAB — BASIC METABOLIC PANEL
ANION GAP: 13 (ref 5–15)
BUN: 11 mg/dL (ref 6–20)
CALCIUM: 8.1 mg/dL — AB (ref 8.9–10.3)
CO2: 24 mmol/L (ref 22–32)
CREATININE: 0.87 mg/dL (ref 0.61–1.24)
Chloride: 103 mmol/L (ref 101–111)
Glucose, Bld: 110 mg/dL — ABNORMAL HIGH (ref 65–99)
Potassium: 3.7 mmol/L (ref 3.5–5.1)
Sodium: 140 mmol/L (ref 135–145)

## 2016-05-29 LAB — HEPATIC FUNCTION PANEL
ALBUMIN: 3.2 g/dL — AB (ref 3.5–5.0)
ALT: 99 U/L — ABNORMAL HIGH (ref 17–63)
AST: 106 U/L — ABNORMAL HIGH (ref 15–41)
Alkaline Phosphatase: 92 U/L (ref 38–126)
BILIRUBIN INDIRECT: 0.8 mg/dL (ref 0.3–0.9)
Bilirubin, Direct: 0.5 mg/dL (ref 0.1–0.5)
TOTAL PROTEIN: 6.3 g/dL — AB (ref 6.5–8.1)
Total Bilirubin: 1.3 mg/dL — ABNORMAL HIGH (ref 0.3–1.2)

## 2016-05-29 LAB — I-STAT TROPONIN, ED: TROPONIN I, POC: 0 ng/mL (ref 0.00–0.08)

## 2016-05-29 LAB — VITAMIN B12: Vitamin B-12: 591 pg/mL (ref 180–914)

## 2016-05-29 LAB — GLUCOSE, CAPILLARY: Glucose-Capillary: 90 mg/dL (ref 65–99)

## 2016-05-29 LAB — LIPASE, BLOOD: LIPASE: 100 U/L — AB (ref 11–51)

## 2016-05-29 MED ORDER — ONDANSETRON HCL 4 MG/2ML IJ SOLN
4.0000 mg | Freq: Once | INTRAMUSCULAR | Status: AC
  Administered 2016-05-29: 4 mg via INTRAVENOUS
  Filled 2016-05-29: qty 2

## 2016-05-29 MED ORDER — THIAMINE HCL 100 MG/ML IJ SOLN
100.0000 mg | Freq: Once | INTRAMUSCULAR | Status: AC
  Administered 2016-05-29: 100 mg via INTRAVENOUS
  Filled 2016-05-29: qty 2

## 2016-05-29 MED ORDER — ONDANSETRON HCL 4 MG/2ML IJ SOLN
4.0000 mg | Freq: Four times a day (QID) | INTRAMUSCULAR | Status: DC | PRN
  Administered 2016-05-29 – 2016-05-30 (×2): 4 mg via INTRAVENOUS
  Filled 2016-05-29 (×2): qty 2

## 2016-05-29 MED ORDER — HYDRALAZINE HCL 20 MG/ML IJ SOLN
5.0000 mg | Freq: Once | INTRAMUSCULAR | Status: AC
  Administered 2016-05-29: 5 mg via INTRAVENOUS
  Filled 2016-05-29: qty 1

## 2016-05-29 MED ORDER — SODIUM CHLORIDE 0.9 % IV SOLN
INTRAVENOUS | Status: AC
  Administered 2016-05-29: 21:00:00 via INTRAVENOUS

## 2016-05-29 MED ORDER — HYDROMORPHONE HCL 2 MG/ML IJ SOLN
1.0000 mg | Freq: Once | INTRAMUSCULAR | Status: AC
  Administered 2016-05-29: 1 mg via INTRAVENOUS
  Filled 2016-05-29: qty 1

## 2016-05-29 MED ORDER — SODIUM CHLORIDE 0.9 % IV BOLUS (SEPSIS)
1000.0000 mL | Freq: Once | INTRAVENOUS | Status: AC
  Administered 2016-05-29: 1000 mL via INTRAVENOUS

## 2016-05-29 MED ORDER — HYDROMORPHONE HCL 2 MG/ML IJ SOLN
1.0000 mg | INTRAMUSCULAR | Status: DC | PRN
  Administered 2016-05-29 – 2016-05-30 (×2): 1 mg via INTRAVENOUS
  Filled 2016-05-29 (×2): qty 1

## 2016-05-29 MED ORDER — NICOTINE 21 MG/24HR TD PT24
21.0000 mg | MEDICATED_PATCH | Freq: Every day | TRANSDERMAL | Status: DC
Start: 2016-05-29 — End: 2016-05-30
  Administered 2016-05-29 – 2016-05-30 (×2): 21 mg via TRANSDERMAL
  Filled 2016-05-29 (×2): qty 1

## 2016-05-29 MED ORDER — LORAZEPAM 2 MG/ML IJ SOLN
0.0000 mg | Freq: Four times a day (QID) | INTRAMUSCULAR | Status: DC
  Administered 2016-05-29 – 2016-05-30 (×4): 1 mg via INTRAVENOUS
  Filled 2016-05-29 (×4): qty 1

## 2016-05-29 MED ORDER — ENOXAPARIN SODIUM 40 MG/0.4ML ~~LOC~~ SOLN
40.0000 mg | SUBCUTANEOUS | Status: DC
  Filled 2016-05-29: qty 0.4

## 2016-05-29 MED ORDER — IOPAMIDOL (ISOVUE-300) INJECTION 61%
INTRAVENOUS | Status: AC
  Administered 2016-05-29: 100 mL
  Filled 2016-05-29: qty 100

## 2016-05-29 MED ORDER — LORAZEPAM 2 MG/ML IJ SOLN
0.0000 mg | Freq: Two times a day (BID) | INTRAMUSCULAR | Status: DC
Start: 2016-05-31 — End: 2016-05-30

## 2016-05-29 NOTE — ED Triage Notes (Signed)
Patient states he has been having nausea, vomiting and diarrhea x 2 weeks.  Patient states he gets dizzy after he has episodes.   Patient denies other symptoms.

## 2016-05-29 NOTE — H&P (Signed)
Date: 05/29/2016               Patient Name:  Vincent Price MRN: [id number]  DOB: [birth year] Age / Sex: 40 y.o., male   PCP: No Pcp Per Patient         Medical Service: Internal Medicine Teaching Service         Attending Physician: Dr. Bonnetta BarryNo att. providers found    First Contact: Dr. Thomasene LotJames Sonni Barse, MD Pager: [phone]  Second Contact: Dr. Reggie PileVasu Rathore, MD Pager: [phone]       After Hours (After 5p/  First Contact Pager: [phone]  weekends / holidays): Second Contact Pager: [phone]   Chief Complaint: Abdominal pain, nausea and vomiting  History of Present Illness: The patient is a 40 year old male with a past medical history of opioid abuse and alcohol abuse who presents with a one-month history of abdominal pain, nausea, vomiting and diarrhea. Patient first noted that the symptoms started about one month ago. Initially, the patient would feel nauseous every morning and would have 1-2 episodes of nonbloody nonbilious emesis. Associated with this nausea the patient had midepigastric abdominal pain that felt like a stabbing with radiation to his back. The patient states he went to the referring facility emergency Department and was diagnosed with acute gastroenteritis and treated with antibiotics. Following his course of antibiotics he did have improvement of the symptoms but they have since returned. He again went to the emergency department and was told he had an ulcer and was treated with a proton pump inhibitor. He had no relief of the symptoms and states over the last week they have acutely worsened. Currently, he endorses a 4-5 day history of severe nausea and diarrhea. Additionally, he states that he is throwing up approximately 5-6 times every day over this time. His emesis has been nonbloody in nature. He continues to have episodes of severe stabbing midepigastric abdominal pain with radiation to the back. Additionally, recently the patient states that he has had a tingling sensation  in his lips and hands bilaterally. These seem to be described as a paresthesia. He also has some balance issues and recent forgetfulness. He denies chest pain or shortness of breath. He denies constipation. He states that he feels like he has had a fever but has not taken his temperature. He endorses chills and night sweats. He denies recent sick contacts, travel or new food exposure. He denies tick bites, camping trips or drinking from a river or stream. He has not been stung by a scorpion. The patient endorses drinking in approximately 5-6 shots of liquor every day even through these episodes of abdominal pain, nausea and vomiting. He continues to smoke one pack of cigarettes per day. 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  In the emergency department the patient was afebrile, tachycardic and hypertensive. Labs were significant for an elevated lipase at 100. AST, ALT were elevated. Albumin went and protein were decreased. 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 CT abdomen and pelvis showed no acute abnormality or finding. Diffuse fatty infiltration of the liver and small fat-containing supraumbilical hernia. The patient was given Dilaudid for abdominal pain and Zofran for nausea and vomiting. [facility] internal [facility] teaching service for further workup and evaluation.  Meds:  Current Meds  Medication Sig  . Aspirin-Salicylamide-Caffeine (BC HEADACHE POWDER PO) Take 1 packet by mouth 2 (two) times daily as needed (pain).  . METHADONE HCL PO Take by  mouth. Dose unknown     Allergies: Allergies as of 05/29/2016  . (No Known Allergies)   History reviewed. No pertinent past medical history.  Family History: Positive for hypertension  Social History: Endorses 5-6 shots of liquor daily and one pack per day smoking. Denies illicit drug use.  Review of Systems: A complete ROS was negative except as per HPI.   Physical Exam: Blood  pressure (!) 170/109, pulse 86, temperature 97.9 F (36.6 C), temperature source Oral, resp. rate 13, SpO2 97 %. Physical Exam  Constitutional: He is oriented to person, place, and time. He appears well-developed and well-nourished.  HENT:  Head: Normocephalic and atraumatic.  Cardiovascular: Normal rate and regular rhythm.  Exam reveals no gallop and no friction rub.   No murmur heard. Respiratory: Effort normal and breath sounds normal. He has no wheezes.  GI: Soft. Bowel sounds are normal. He exhibits no distension. There is no tenderness.  Musculoskeletal: He exhibits no edema.  Neurological: He is alert and oriented to person, place, and time.     EKG: No acute ST segment elevation  CXR: No acute cardiopulmonary disease  Assessment & Plan by Problem: Active Problems:   * No active hospital problems. *  1. Pancreatitis Patient presents with nausea, vomiting and midepigastric abdominal pain with radiation to the back. Lipase was elevated. Although the patient's CT did not show an acutely edematous inflamed pancreas based on the clinical presentation and elevated lipase I think the most likely etiology for the patient's presentation is acute pancreatitis. Common causes of pancreatitis include gallstones and alcohol abuse. Based on the patient's substantial drinking history of think the most likely cause of his pancreatitis is secondary to alcohol abuse. -- Nothing by mouth -- Zofran for nausea -- Normal saline 150 mL per hour -- IV Dilaudid 1 mg every 4 hours as needed for pain  2. Transaminitis Patient with an elevated AST and ALT at 106 and 99 respectively. Albumin decreased at 3.2. Total bilirubin elevated at 1.3. The most likely etiology for the patient's transaminitis is secondary to alcohol abuse although the AST ALT ratio was not what has classically been described for this condition. Given the patient's past history of opioid abuse I think working up hepatitis panel is also  appropriate in this patient as is a right upper quadrant ultrasound. -- Right upper quadrant ultrasound -- Hepatitis panel  3. Perioral and upper extremity paresthesias The patient reports perioral and bilateral hand tingling. Additionally, he states he has felt somewhat off balance and has been more forgetful lately. His lab work demonstrates megaloblastic red blood cells. Red blood cells and the size are often caused by folate and B12 deficiencies which would be common in a patient with an alcohol abuse history. I think severe vitamin deficiencies may be the cause of the patient's perioral and upper extremity paresthesias with supporting evidence based on the patient's red blood cell size in the setting of a normal red blood cell distribution width. -- B12 and folate labs -- Thiamine  4. Hypertension On chart review the patient's blood pressure has been elevated previous visits. His most recent blood pressure was 170/109. He does not carry a formal diagnosis of hypertension but at this time I think the patient does have hypertension and we will begin treatment overnight. -- IV hydralazine 5mg   5. Alcohol abuse -- CIWA  DVT/PE prophylaxis: Lovenox FEN/GI: Nothing by mouth    Dispo: Admit patient to Observation with expected length of stay less than 2  midnights.  Signed: Thomasene Lot, MD 05/29/2016, 7:03 PM  Pager: [phone]

## 2016-05-29 NOTE — ED Provider Notes (Signed)
[facility]-EMERGENCY DEPT Provider Note   CSN: [id number] Arrival date & time: 05/29/16  1018     History   Chief Complaint Chief Complaint  Patient presents with  . Dizziness  . Nausea  . Emesis    HPI Vincent Price is a 40 y.o. male who presents emergency Department with chief complaint of nausea and vomiting. The patient takes daily methadone. He is a daily smoker. He uses alcohol and his wife states he has 2 or 3 shots of liquor nightly. The patient has had 1 month of persistent daily vomiting. [facility] [place] about a month ago and told that he had a GI bacterial infection. He was started on antibiotics and anti-emetics. He completed the course of medications and states that he got somewhat better but came back and never fully stopped vomiting. The patient returned about a week and half later with persistent vomiting and this time had epigastric pain. He is told that he had alcohol withdrawal, gastritic, potentially peptic ulcer disease. He was discharged with Phenergan and other medications. He states that he has not been very helpful in stopping his nausea and vomiting. Over the past 4 days he has had persistent hourly vomiting and has been unable to hold down any foods or fluids until last night when he had a large meal. He's had several episodes of. Oral paresthesia and bilateral hand paresthesias, he has associated orthostatic dizziness, weakness. He states that the pain in his abdomen is located in the epigastrium. It comes and goes. It is described as sharp. Nothing seems to make it any worse or better. He states that he made one large bowel movement this morning. He denies a history of diarrhea, or constipation. Patient denies any other neurologic symptoms .  HPI  History reviewed. No pertinent past medical history.  There are no active problems to display for this patient.   Past Surgical History:  Procedure Laterality Date   . HERNIA REPAIR         Home Medications    Prior to Admission medications   Medication Sig Start Date End Date Taking? Authorizing Provider  METHADONE HCL PO Take by mouth. [place]   Yes Historical Provider, MD  Aspirin-Salicylamide-Caffeine (BC HEADACHE POWDER PO) Take 1 packet by mouth 2 (two) times daily as needed (pain).    Historical Provider, MD  sulfamethoxazole-trimethoprim (BACTRIM DS) 800-160 MG tablet Take 2 tablets by mouth 2 (two) times daily. Patient not taking: Reported on 05/29/2016 03/30/15   Wynetta EmeryNicole Pisciotta, PA-C    Family History No family history on file.  Social History Social History  Substance Use Topics  . Smoking status: Current Every Day Smoker    Packs/day: 1.00    Types: Cigarettes  . Smokeless tobacco: Not on file  . Alcohol use 1.2 oz/week    2 Shots of liquor per week     Allergies   Patient has no known allergies.   Review of Systems Review of Systems  Ten systems reviewed and are negative for acute change, except as noted in the HPI.   Physical Exam Updated Vital Signs BP (!) 158/114 (BP Location: Right Arm)   Pulse 91   Temp 97.9 F (36.6 C) (Oral)   Resp 23   SpO2 98%   Physical Exam  Constitutional: He is oriented to person, place, and time. He appears well-developed and well-nourished. No distress.  HENT:  Head: Normocephalic and atraumatic.  Eyes: Conjunctivae are normal. No  scleral icterus.  Neck: Normal range of motion. Neck supple.  Cardiovascular: Normal rate, regular rhythm and normal heart sounds.   Pulmonary/Chest: Effort normal and breath sounds normal. No respiratory distress.  Abdominal: Soft. He exhibits no distension. There is tenderness. There is no guarding.  Musculoskeletal: He exhibits no edema.  Neurological: He is alert and oriented to person, place, and time.  Speech is clear and goal oriented, follows commands Major Cranial nerves without deficit, no facial droop Normal strength in upper and  lower extremities bilaterally including dorsiflexion and plantar flexion, strong and equal grip strength Sensation normal to light and sharp touch Moves extremities without ataxia, coordination intact Normal finger to nose and rapid alternating movements No pronator drift   Skin: Skin is warm and dry. He is not diaphoretic.  Psychiatric: His behavior is normal.  Nursing note and vitals reviewed.    ED Treatments / Results  Labs (all labs ordered are listed, but only abnormal results are displayed) Labs Reviewed  BASIC METABOLIC PANEL - Abnormal; Notable for the following:       Result Value   Glucose, Bld 110 (*)    Calcium 8.1 (*)    All other components within normal limits  CBC - Abnormal; Notable for the following:    RBC 4.18 (*)    MCV 100.7 (*)    MCH 35.4 (*)    Platelets 126 (*)    All other components within normal limits  URINALYSIS, ROUTINE W REFLEX MICROSCOPIC  HEPATIC FUNCTION PANEL  LIPASE, BLOOD  CBG MONITORING, ED  I-STAT TROPOININ, ED    EKG  EKG Interpretation None       Radiology No results found.  Procedures Procedures (including critical care time)  Medications Ordered in ED Medications  sodium chloride 0.9 % bolus 1,000 mL (not administered)  thiamine (B-1) injection 100 mg (not administered)  sodium chloride 0.9 % bolus 1,000 mL (not administered)     Initial Impression / Assessment and Plan / ED Course  I have reviewed the triage vital signs and the nursing notes.  Pertinent labs & imaging results that were available during my care of the patient were reviewed by me and considered in my medical decision making (see chart for details).  Clinical Course as of May 30 1707  Thu May 29, 2016  1643 CT is negative CT ABDOMEN PELVIS W CONTRAST [AH]    Clinical Course User Index [AH] Arthor CaptainAbigail Divit Stipp, PA-C    Patient with pancreatitis. His CT ab is negative for acute abnormality.  Patient has transaminitis. Persistant vomiting pta  and epigastric tenderness. Patient will be admitted  For acute pancreatitis.   Final Clinical Impressions(s) / ED Diagnoses   Final diagnoses:  Acute pancreatitis, unspecified complication status, unspecified pancreatitis type    New Prescriptions New Prescriptions   No medications on file     Arthor Captainbigail Havannah Streat, PA-C 05/30/16 1721    Doug SouSam Jacubowitz, MD 06/02/16 1339

## 2016-05-29 NOTE — ED Notes (Signed)
Patient is now trying to get an urine sample

## 2016-05-29 NOTE — ED Provider Notes (Signed)
Complains of intermittent epigastric pain with vomiting for the past 1 month. States he's vomited "everything I eat or drink" for the past 2 days. He presents today with lightheadedness. He's been seen by multiple emergency departments for the same complaint. Patient admits to drinking one or 2 drinks per day however drank heavily several years ago. He feels improved since treatment with intravenous hydration here. On exam he is alert nontoxic abdomen nondistended, minimally tender epigastrium without guarding rigidity or rebound.   Doug SouSam Ramiro Pangilinan, MD 05/29/16 1723

## 2016-05-29 NOTE — ED Notes (Signed)
Pt complaining of abd pain and nausea.

## 2016-05-29 NOTE — ED Notes (Signed)
Attempted to place and IV. Unsuccessful after 2 attempts. IV team consult will be placed.

## 2016-05-30 DIAGNOSIS — F1020 Alcohol dependence, uncomplicated: Secondary | ICD-10-CM

## 2016-05-30 DIAGNOSIS — D696 Thrombocytopenia, unspecified: Secondary | ICD-10-CM | POA: Diagnosis present

## 2016-05-30 DIAGNOSIS — F1010 Alcohol abuse, uncomplicated: Secondary | ICD-10-CM | POA: Diagnosis present

## 2016-05-30 DIAGNOSIS — K700 Alcoholic fatty liver: Secondary | ICD-10-CM | POA: Diagnosis present

## 2016-05-30 DIAGNOSIS — I10 Essential (primary) hypertension: Secondary | ICD-10-CM

## 2016-05-30 DIAGNOSIS — D7589 Other specified diseases of blood and blood-forming organs: Secondary | ICD-10-CM | POA: Diagnosis present

## 2016-05-30 DIAGNOSIS — Z720 Tobacco use: Secondary | ICD-10-CM | POA: Diagnosis present

## 2016-05-30 DIAGNOSIS — R740 Nonspecific elevation of levels of transaminase and lactic acid dehydrogenase [LDH]: Secondary | ICD-10-CM

## 2016-05-30 DIAGNOSIS — K852 Alcohol induced acute pancreatitis without necrosis or infection: Secondary | ICD-10-CM

## 2016-05-30 LAB — COMPREHENSIVE METABOLIC PANEL
ALT: 89 U/L — ABNORMAL HIGH (ref 17–63)
AST: 111 U/L — AB (ref 15–41)
Albumin: 3.3 g/dL — ABNORMAL LOW (ref 3.5–5.0)
Alkaline Phosphatase: 102 U/L (ref 38–126)
Anion gap: 12 (ref 5–15)
BILIRUBIN TOTAL: 0.9 mg/dL (ref 0.3–1.2)
BUN: 8 mg/dL (ref 6–20)
CO2: 24 mmol/L (ref 22–32)
CREATININE: 0.88 mg/dL (ref 0.61–1.24)
Calcium: 8 mg/dL — ABNORMAL LOW (ref 8.9–10.3)
Chloride: 101 mmol/L (ref 101–111)
Glucose, Bld: 88 mg/dL (ref 65–99)
POTASSIUM: 3.2 mmol/L — AB (ref 3.5–5.1)
Sodium: 137 mmol/L (ref 135–145)
TOTAL PROTEIN: 6.2 g/dL — AB (ref 6.5–8.1)

## 2016-05-30 LAB — CBC
HEMATOCRIT: 39.3 % (ref 39.0–52.0)
Hemoglobin: 14 g/dL (ref 13.0–17.0)
MCH: 35.7 pg — ABNORMAL HIGH (ref 26.0–34.0)
MCHC: 35.6 g/dL (ref 30.0–36.0)
MCV: 100.3 fL — AB (ref 78.0–100.0)
PLATELETS: 101 10*3/uL — AB (ref 150–400)
RBC: 3.92 MIL/uL — AB (ref 4.22–5.81)
RDW: 13.6 % (ref 11.5–15.5)
WBC: 6.5 10*3/uL (ref 4.0–10.5)

## 2016-05-30 LAB — GLUCOSE, CAPILLARY
Glucose-Capillary: 103 mg/dL — ABNORMAL HIGH (ref 65–99)
Glucose-Capillary: 129 mg/dL — ABNORMAL HIGH (ref 65–99)
Glucose-Capillary: 96 mg/dL (ref 65–99)
Glucose-Capillary: 98 mg/dL (ref 65–99)

## 2016-05-30 MED ORDER — FOLIC ACID 1 MG PO TABS
1.0000 mg | ORAL_TABLET | Freq: Every day | ORAL | Status: DC
  Administered 2016-05-30: 1 mg via ORAL
  Filled 2016-05-30 (×2): qty 1

## 2016-05-30 MED ORDER — ONDANSETRON HCL 4 MG PO TABS: 4.0000 mg | ORAL_TABLET | Freq: Three times a day (TID) | ORAL | 0 refills | Status: DC | PRN

## 2016-05-30 MED ORDER — CYANOCOBALAMIN 50 MCG PO TABS: 50.0000 ug | ORAL_TABLET | Freq: Every day | ORAL | 0 refills | Status: DC

## 2016-05-30 MED ORDER — VITAMIN B-12 100 MCG PO TABS
50.0000 ug | ORAL_TABLET | Freq: Every day | ORAL | Status: DC
  Administered 2016-05-30: 50 ug via ORAL
  Filled 2016-05-30: qty 1

## 2016-05-30 MED ORDER — LISINOPRIL 10 MG PO TABS
10.0000 mg | ORAL_TABLET | Freq: Every day | ORAL | Status: DC
  Administered 2016-05-30: 10 mg via ORAL
  Filled 2016-05-30: qty 1

## 2016-05-30 MED ORDER — ORAL CARE MOUTH RINSE
15.0000 mL | Freq: Two times a day (BID) | OROMUCOSAL | Status: DC
  Administered 2016-05-30: 15 mL via OROMUCOSAL

## 2016-05-30 MED ORDER — FOLIC ACID 1 MG PO TABS: 1.0000 mg | ORAL_TABLET | Freq: Every day | ORAL | 0 refills | Status: DC

## 2016-05-30 MED ORDER — POTASSIUM CHLORIDE CRYS ER 20 MEQ PO TBCR
30.0000 meq | EXTENDED_RELEASE_TABLET | Freq: Once | ORAL | Status: AC
  Administered 2016-05-30: 30 meq via ORAL
  Filled 2016-05-30: qty 1

## 2016-05-30 MED ORDER — LISINOPRIL 10 MG PO TABS: 10.0000 mg | ORAL_TABLET | Freq: Every day | ORAL | 3 refills | Status: DC

## 2016-05-30 MED ORDER — HYDRALAZINE HCL 20 MG/ML IJ SOLN
5.0000 mg | Freq: Once | INTRAMUSCULAR | Status: AC
  Administered 2016-05-30: 5 mg via INTRAVENOUS
  Filled 2016-05-30: qty 1

## 2016-05-30 MED FILL — ONDANSETRON HCL 4 MG TABLET: 4 | 6 days supply | Qty: 20 | Fill #0

## 2016-05-30 MED FILL — FOLIC ACID 1 MG TABLET: 1 | 30 days supply | Qty: 30 | Fill #0

## 2016-05-30 MED FILL — ?LISINOPRIL 10 MG TABLET: 10 | 30 days supply | Qty: 30 | Fill #0

## 2016-05-30 NOTE — Progress Notes (Signed)
Pt without health insurance .CM arranged [facility] Dr. Timoteo GaulSword on 06/11/2016 @ 3pm and [facility] CM made pt aware and provided pt with a [facility] brochure. Gae GallopAngela Sael Furches RN,BSN,CM

## 2016-05-30 NOTE — Progress Notes (Signed)
Patient was discharged home by MD order; discharged instructions  review and give to patient with care notes and prescriptions; IV DIC; patient had an appointment scheduled with [facility]; patient refused to be escorted to the car by staff.

## 2016-05-30 NOTE — Progress Notes (Signed)
   Subjective: No acute events overnight. The patient had no additional episodes of emesis. He has nausea but this is very well controlled and is only minimally bothersome. Additionally, he states that his abdominal pain is greatly improved. He also thinks his perioral paresthesias have gone away. This morning the patient stated that he knows his alcohol has been an issue for him and he is willing and ready to cut back. He understands that the most likely cause of his abdominal pain and nausea and vomiting over the previous week has been associated heavy alcohol abuse.  Objective:  Vital signs in last 24 hours: Vitals:   05/29/16 2057 05/30/16 0010 05/30/16 0140 05/30/16 0429  BP: (!) 158/106 (!) 165/115 (!) 155/94 (!) 146/93  Pulse: 89 95 76 77  Resp: 18 18  18   Temp: 99.2 F (37.3 C) 99.2 F (37.3 C)  98.8 F (37.1 C)  TempSrc: Oral Oral  Oral  SpO2: 100% 99%  98%  Weight:      Height:       Physical Exam  Constitutional: He is oriented to person, place, and time. He appears well-developed and well-nourished.  HENT:  Head: Normocephalic and atraumatic.  Cardiovascular: Normal rate and regular rhythm.  Exam reveals no gallop and no friction rub.   No murmur heard. Respiratory: Effort normal and breath sounds normal. No respiratory distress. He has no wheezes.  GI: Soft. Bowel sounds are normal. He exhibits no distension. There is no tenderness.  No tenderness to palpation on abdominal examination  Musculoskeletal: He exhibits no edema.  Neurological: He is alert and oriented to person, place, and time.     Assessment/Plan:  Active Problems:   Abdominal pain  1. Pancreatitis I think the etiology of the patient's abdominal pain, nausea and vomiting were secondary to mild alcohol-induced pancreatitis. He has improved with conservative measures. We will try a clear liquid diet this afternoon if he is tolerating by mouth intake will plan for discharge home this evening. -- Clear  liquid diet -- Zofran for nausea -- Normal saline 150 mL per hour -- IV Dilaudid 1 mg every 4 hours as needed for pain  2. Transaminitis AST 111 and ALT of 89 this morning. Most likely etiology for the patient's transaminitis is alcohol abuse. Given the patient's past history of opioid abuse I think obtaining a hepatitis panel would also be appropriate. Additionally, the patient had a right upper quadrant ultrasound performed this morning which did not show cholelithiasis or other causes for the patient's elevated transaminases. -- Right upper quadrant ultrasound- hepatic steatosis, normal gallbladder and bile ducts -- Hepatitis panel- follow-up  3. Perioral and upper extremity paresthesias The patient's perioral  paresthesias have resolved today. His B12 was within normal limits. Folate has not returned. Given the patient's alcohol abuse history and poor nutrition we will provide the patient B12 and folate supplementation today. -- B12 WNL -- Follow-up folate level -- Folate and B12 supplementation -- Thiamine  4. Hypertension The patient was given hydralazine 5 mg overnight for hypertension. His most recent blood pressure is 146/93. At this time the patient is diagnosed with hypertension. We'll start the patient on once daily lisinopril 20 mg. -- Lisinopril 20 mg once daily  5. Alcohol abuse -- CIWA  DVT/PE prophylaxis: Lovenox FEN/GI: Nothing by mouth   Dispo: Anticipated discharge this afternoon if the patient is tolerating an oral diet.  Thomasene LotJames Dallys Nowakowski, MD 05/30/2016, 12:38 PM Pager: [phone]

## 2016-05-30 NOTE — Discharge Summary (Signed)
Name: Vincent BranchWilliam Manes MRN: [id number] DOB: Nov 28, 1975 40 y.o. PCP: No Pcp Per Patient  Date of Admission: 05/29/2016 11:01 AM Date of Discharge: 05/30/2016 Attending Physician: Gust RungErik C Hoffman, DO  Discharge Diagnosis: 1. Acute alcoholic pancreatitis 2. Hypertension 3. Alcohol abuse 4. Transaminitis Principal Problem:   Acute alcoholic pancreatitis Active Problems:   Alcohol abuse   Macrocytosis without anemia   Thrombocytopenia (HCC)   Alcohol induced fatty liver   Tobacco abuse   Discharge Medications:   Medication List    STOP taking these medications   BC HEADACHE POWDER PO   sulfamethoxazole-trimethoprim 800-160 MG tablet Commonly known as:  BACTRIM DS     TAKE these medications   cyanocobalamin 50 MCG tablet Take 1 tablet (50 mcg total) by mouth daily. Start taking on:  05/31/2016   folic acid 1 MG tablet Commonly known as:  FOLVITE Take 1 tablet (1 mg total) by mouth daily. Start taking on:  05/31/2016   lisinopril 10 MG tablet Commonly known as:  PRINIVIL,ZESTRIL Take 1 tablet (10 mg total) by mouth daily. Start taking on:  05/31/2016   METHADONE HCL PO Take by mouth. Dose unknown   ondansetron 4 MG tablet Commonly known as:  ZOFRAN Take 1 tablet (4 mg total) by mouth every 8 (eight) hours as needed for nausea or vomiting.       Disposition and follow-up:   Mr.Melroy Fugate [facility] Good [facility].  At the hospital follow up visit please address:  1.  Please ensure the patient is taking lisinopril. Please follow-up on the patient's hepatitis panel.  2.  Labs / imaging needed at time of follow-up: Basic metabolic panel to assess renal function after starting a new blood pressure medicine. Please follow-up on the patient's hepatitis panel.  3.  Pending labs/ test needing follow-up: None  Follow-up Appointments:   Hospital Course by problem list: Principal Problem:   Acute alcoholic  pancreatitis Active Problems:   Alcohol abuse   Macrocytosis without anemia   Thrombocytopenia (HCC)   Alcohol induced fatty liver   Tobacco abuse   1. Acute alcoholic pancreatitis The [facility] on [facility]/2016 with a one-week history of increased nausea, vomiting and midepigastric abdominal pain with radiation to the back. The patient has had similar symptoms dating back for approximately one month but they acutely worsened over the last 5 days prior to admission. On admission the patient was noted to be tachycardic and hypertensive but was afebrile. Lab work revealed an elevated lipase. CT abdomen and pelvis did not show inflammation of the pancreas. On further history the patient has a severe alcohol abuse disorder tracking approximately 5-6 liquor shots per day. Based on the patient's clinical presentation, history and elevated lipase the most likely cause for the patient's abdominal pain, nausea and vomiting was acute alcoholic pancreatitis. He was treated conservatively with IV therapy, pain control and anti-emetics with improvement. After one day in the hospital the patient was tolerating a clear liquid diet. He stated that he had no episodes of vomiting and his nausea was very well controlled. He also stated that he is ready to cut back alcohol as he understands this is starting to have a large impact on his life. On the day of discharge the patient was tolerating by mouth intake was having no additional episodes of emesis. His pain was well-controlled.  2. Hypertension Patient had hypertension at admission. His systolic blood pressure was extremely elevated.  He was treated with 1 dose of IV hydralazine overnight. He remained hypertensive during his inpatient stay and he was started on lisinopril 20 mg once daily. For the time of discharge the patient's blood pressure was 150/99. Benefit from a basic metabolic panel in the outpatient setting to  evaluate his renal function after starting a new antihypertensive medication. Additionally, he may need further antihypertensive therapy. He will be discharged on 20 mg lisinopril once daily.  3. Alcohol abuse disorder Patient admits to heavy drinking and is currently drinking 5-6 shots of liquor daily. His alcohol abuse is the most likely cause of his abdominal pain, nausea and vomiting which led to a an acute alcoholic pancreatitis. The patient is ready to quit and he will pursue resources in his community upon returning home. We counseled him on the medical effects of alcohol and have encouraged him to remain from drinking in the future.  4. Transaminitis The patient had a transaminitis on admission with an AST of 111 and ALT of 89. The most likely cause for the patient's elevated transaminases are from alcohol abuse. However, given the patient's past opioid abuse and hepatitis panel was serologies was ordered. At the time of discharge the results of this test had not resulted. Please follow these up in the outpatient setting. Discharge Vitals:   BP (!) 150/99 (BP Location: Right Arm)   Pulse 97   Temp 98 F (36.7 C) (Oral)   Resp 20   Ht 6\' 1"  (1.854 m)   Wt 193 lb 6.4 oz (87.7 kg)   SpO2 99%   BMI 25.52 kg/m   Pertinent Labs, Studies, and Procedures:  1. Lipase of 100 2. CT abdomen pelvis-no acute abnormality or finding diffuse fatty infiltration of the liver.  Discharge Instructions: Discharge Instructions    Diet - low sodium heart healthy    Complete by:  As directed    Discharge instructions    Complete by:  As directed    We treated you for alcohol-induced pancreatitis.  Please ensure you reduce your alcohol intake. For nausea you can use Zofran. I have also written a prescription for several vitamins including folate. Please take one vitamin a day for the next 7 days.  You also have high blood pressure. We have started a medicine called lisinopril for your blood pressure.  Please take 1 pill every day.  Please make an appointment to see your primary care physician where you live. If you do not have one please find one and make an appointment as soon as possible as they will need to follow up with you after you leave the hospital.   Increase activity slowly    Complete by:  As directed       Signed: Thomasene LotJames Ahmeer Tuman, MD 05/30/2016, 2:47 PM   Pager: [phone]

## 2016-05-31 LAB — HEPATITIS PANEL, ACUTE
HCV Ab: <0.1 s/co ratio (ref 0.0–0.9)
HEP B C IGM: NEGATIVE
Hep A IgM: NEGATIVE
Hepatitis B Surface Ag: NEGATIVE

## 2016-06-02 LAB — FOLATE RBC
Folate, Hemolysate: 345.2 ng/mL
Folate, RBC: 911 ng/mL (ref >498)
HEMATOCRIT: 37.9 % (ref 37.5–51.0)

## 2016-06-11 ENCOUNTER — Ambulatory Visit: Payer: Self-pay | Attending: Internal Medicine | Admitting: Internal Medicine

## 2016-06-11 ENCOUNTER — Encounter: Payer: Self-pay | Admitting: Internal Medicine

## 2016-06-11 VITALS — BP 145/105 | HR 100 | Temp 99.0°F | Resp 18 | Ht 74.0 in | Wt 195.0 lb

## 2016-06-11 DIAGNOSIS — F1020 Alcohol dependence, uncomplicated: Secondary | ICD-10-CM

## 2016-06-11 MED ORDER — LISINOPRIL 20 MG PO TABS: 20.0000 mg | ORAL_TABLET | Freq: Every day | ORAL | 3 refills | Status: DC

## 2016-06-11 MED ORDER — CLONIDINE HCL 0.1 MG PO TABS: 0.1000 mg | ORAL_TABLET | Freq: Three times a day (TID) | ORAL | 3 refills | Status: DC

## 2016-06-11 MED ORDER — CLONIDINE HCL 0.1 MG PO TABS
0.1000 mg | ORAL_TABLET | Freq: Once | ORAL | Status: AC
  Administered 2016-06-11: 0.1 mg via ORAL

## 2016-06-11 MED FILL — cloNIDine HCL 0.1 MG TABS: 0.1 | 30 days supply | Qty: 90 | Fill #0

## 2016-06-11 NOTE — Progress Notes (Signed)
Acute alcoholic pancreatitis- It's worth noting that lipase only 100. CT abdomen- no significant findings other than fatty liver.  Has some nausea not controlled by zofran.   HTN- home bps 160/120s. He is taking lisinopril.   transaminitis- ? Related to  etoh or pancreatitis  Past Medical History:  Diagnosis Date  . Hypertension     Social History   Social History  . Marital status: Single    Spouse name: N/A  . Number of children: N/A  . Years of education: N/A   Occupational History  . Not on file.   Social History Main Topics  . Smoking status: Current Every Day Smoker    Packs/day: 1.00    Types: Cigarettes  . Smokeless tobacco: Never Used  . Alcohol use 1.2 oz/week    2 Shots of liquor per week  . Drug use:     Types: Marijuana  . Sexual activity: Not on file   Other Topics Concern  . Not on file   Social History Narrative  . No narrative on file    Past Surgical History:  Procedure Laterality Date  . HERNIA REPAIR      History reviewed. No pertinent family history.  No Known Allergies  Current Outpatient Prescriptions on File Prior to Visit  Medication Sig Dispense Refill  . cyanocobalamin 50 MCG tablet Take 1 tablet (50 mcg total) by mouth daily. 30 tablet 0  . folic acid (FOLVITE) 1 MG tablet Take 1 tablet (1 mg total) by mouth daily. 30 tablet 0  . lisinopril (PRINIVIL,ZESTRIL) 10 MG tablet Take 1 tablet (10 mg total) by mouth daily. 30 tablet 3  . METHADONE HCL PO Take by mouth. Dose unknown    . ondansetron (ZOFRAN) 4 MG tablet Take 1 tablet (4 mg total) by mouth every 8 (eight) hours as needed for nausea or vomiting. 20 tablet 0  . ondansetron (ZOFRAN) 4 MG tablet Take 1 tablet (4 mg total) by mouth every 8 (eight) hours as needed for nausea or vomiting. 20 tablet 0   No current facility-administered medications on file prior to visit.      patient denies chest pain, shortness of breath, orthopnea. Denies lower extremity edema, abdominal pain,  change in appetite, change in bowel movements. Patient denies rashes, musculoskeletal complaints. No other specific complaints in a complete review of systems.   Pulse 100   Temp 99 F (37.2 C) (Oral)   Resp 18   Ht 6\' 2"  (1.88 m)   Wt 195 lb (88.5 kg)   SpO2 100%   BMI 25.04 kg/m   well-developed well-nourished male in no acute distress. HEENT exam atraumatic, normocephalic, neck supple without jugular venous distention. Chest clear to auscultation cardiac exam S1-S2 are regular. Abdominal exam overweight with bowel sounds, soft and nontender. Extremities no edema. Neurologic exam is alert with a normal gait.   ETOH abuse- this is the underlying issue today He continues to drink 1 pint- 1/5th per day. I think he needs a plan and that will be tough today I have talked with him and his wife. I'll refer to psychiatry in hopes of developing a safe withdrawal plan.   Htn- clonidine in the office.

## 2016-06-11 NOTE — Progress Notes (Signed)
Patient is here for HFU  Patient denies pain at this time. Patient complains of nausea.  Patient tolerated clonidine 0.1mg  well today.

## 2016-06-12 ENCOUNTER — Telehealth: Payer: Self-pay | Admitting: General Practice

## 2016-06-12 ENCOUNTER — Other Ambulatory Visit: Payer: Self-pay | Admitting: *Deleted

## 2016-06-12 MED ORDER — ONDANSETRON HCL 4 MG PO TABS: 4.0000 mg | ORAL_TABLET | Freq: Three times a day (TID) | ORAL | 0 refills | Status: DC | PRN

## 2016-06-12 MED FILL — LISINOPRIL 20 MG TABLET: 20 | 30 days supply | Qty: 30 | Fill #0

## 2016-06-12 MED FILL — ?ONDANSETRON HCL 4 MG TABLE: 4 | 6 days supply | Qty: 20 | Fill #0

## 2016-06-12 NOTE — Telephone Encounter (Signed)
Patient needs a refill for zofran

## 2016-06-12 NOTE — Telephone Encounter (Signed)
[facility]. Patient made aware in office.

## 2016-06-25 ENCOUNTER — Other Ambulatory Visit (HOSPITAL_COMMUNITY): Payer: Self-pay | Admitting: Internal Medicine

## 2016-06-25 ENCOUNTER — Other Ambulatory Visit: Payer: Self-pay | Admitting: Internal Medicine

## 2016-07-04 ENCOUNTER — Other Ambulatory Visit: Payer: Self-pay | Admitting: Internal Medicine

## 2016-07-04 ENCOUNTER — Encounter: Payer: Self-pay | Admitting: Licensed Clinical Social Worker

## 2016-07-04 ENCOUNTER — Other Ambulatory Visit: Payer: Self-pay | Admitting: *Deleted

## 2016-07-04 ENCOUNTER — Ambulatory Visit: Payer: Self-pay | Attending: Internal Medicine | Admitting: *Deleted

## 2016-07-04 VITALS — BP 133/90 | HR 100 | Resp 20

## 2016-07-04 DIAGNOSIS — I10 Essential (primary) hypertension: Secondary | ICD-10-CM | POA: Insufficient documentation

## 2016-07-04 NOTE — Progress Notes (Signed)
Pt arrived for BP check. He was seen by Dr. Cato MulliganSwords in 05/2016. Pt also request zofran and folic acid refill. He has intermittent n/v.

## 2016-07-04 NOTE — BH Specialist Note (Signed)
After visit with Triage Nurse, pt requested to speak to LCSWA. LCSWA introduced self and explained [facility].   Pt reported that he has been diagnosed with Depression and Anxiety. He stated that he was recently hospitalized and is in need of services. Pt works in [employer]; however, is currently unemployed due to medical health. Pt receives emotional support from girlfriend.   LCSWA inquired about pt's coping skills. Pt disclosed that he uses alcohol to cope with stressors. He stated that he has recently decreased use. LCSWA discussed how substance use can negatively impact physical and mental health. LCSWA encouraged pt to apply healthy coping skills to decrease symptoms. Pt is not interested in AA or substance use treatment at present time. He is interested in medication management.  LCSWA provided pt and girlfriend with community resources for crisis intervention, psychotherapy, medication management, and food insecurity. LCSWA discussed benefits of scheduling an appointment with Financial Counseling to assist in financial strain. Pt agreed to obtain application packet and schedule appointment in the near future. Pt was encouraged to contact LCSWA if symptoms worsen or fail to improve to schedule [facility].   Bridgett LarssonJasmine D Leena Tiede, MSW, LCSWA  Clinical Social Worker 07/07/16 10:26 AM

## 2016-07-04 NOTE — Telephone Encounter (Signed)
Pt was seen by Dr. Cato MulliganSwords in December. He came in for Blood pressure check. While in office he request Zofran and Folic Acid RF.

## 2016-07-15 ENCOUNTER — Other Ambulatory Visit: Payer: Self-pay | Admitting: General Practice

## 2016-07-15 MED ORDER — ONDANSETRON HCL 4 MG PO TABS
4.0000 mg | ORAL_TABLET | Freq: Three times a day (TID) | ORAL | 0 refills | Status: DC | PRN
Start: 2016-07-15 — End: 2016-10-02

## 2016-07-15 MED FILL — ?LISINOPRIL 20 MG TABLET: 20 | 30 days supply | Qty: 30 | Fill #1

## 2016-07-15 MED FILL — cloNIDine HCL 0.1 MG TABS: 0.1 | 30 days supply | Qty: 90 | Fill #1

## 2016-07-15 NOTE — Telephone Encounter (Signed)
Patient has an appointment on 07/21/16 to establish care and would like to have a refill on Zofran. Please f/u

## 2016-07-15 NOTE — Telephone Encounter (Signed)
Zofran (ondansetron) refilled - patient must have office visit for any further refills.

## 2016-07-21 ENCOUNTER — Ambulatory Visit: Payer: Self-pay | Admitting: Family Medicine

## 2016-08-08 MED FILL — ?LISINOPRIL 20 MG TABLET: 20 | 30 days supply | Qty: 30 | Fill #2

## 2016-08-08 MED FILL — ONDANSETRON HCL 4 MG TABLET: 4 | 7 days supply | Qty: 20 | Fill #0

## 2016-08-08 MED FILL — cloNIDine HCL 0.1 MG TABS: 0.1 | 30 days supply | Qty: 90 | Fill #2

## 2016-08-20 ENCOUNTER — Ambulatory Visit: Payer: Self-pay | Admitting: Family Medicine

## 2016-08-28 ENCOUNTER — Ambulatory Visit: Payer: Self-pay | Admitting: Family Medicine

## 2016-08-28 NOTE — Progress Notes (Deleted)
   Subjective:  Patient ID: Vincent Price, male    DOB: 1975-11-09  Age: 41 y.o. MRN: [id number]  CC: No chief complaint on file.   HPI Vincent Price presents for   HTN:   History of Alcoholism:  History of psychiatry referral for treatment.  Outpatient Medications Prior to Visit  Medication Sig Dispense Refill  . cloNIDine (CATAPRES) 0.1 MG tablet Take 1 tablet (0.1 mg total) by mouth 3 (three) times daily. 90 tablet 3  . cyanocobalamin 50 MCG tablet Take 1 tablet (50 mcg total) by mouth daily. 30 tablet 0  . folic acid (FOLVITE) 1 MG tablet Take 1 tablet (1 mg total) by mouth daily. 30 tablet 0  . lisinopril (PRINIVIL,ZESTRIL) 20 MG tablet Take 1 tablet (20 mg total) by mouth daily. 90 tablet 3  . METHADONE HCL PO Take by mouth. Dose unknown    . ondansetron (ZOFRAN) 4 MG tablet Take 1 tablet (4 mg total) by mouth every 8 (eight) hours as needed for nausea or vomiting. 20 tablet 0   No facility-administered medications prior to visit.     ROS Review of Systems     Objective:  There were no vitals taken for this visit.  BP/Weight 07/04/2016 06/11/2016 05/30/2016  Systolic BP 133 145 151  Diastolic BP 90 105 115  Wt. (Lbs) - 195 -  BMI - 25.04 -     Physical Exam   Assessment & Plan:   Problem List Items Addressed This Visit    None      No orders of the defined types were placed in this encounter.   Follow-up: No Follow-up on file.   Lizbeth BarkMandesia R Dyanara Cozza FNP

## 2016-09-25 ENCOUNTER — Other Ambulatory Visit: Payer: Self-pay | Admitting: Internal Medicine

## 2016-09-25 MED ORDER — LISINOPRIL 20 MG PO TABS
20.0000 mg | ORAL_TABLET | Freq: Every day | ORAL | 3 refills | Status: DC
Start: 2016-09-25 — End: 2016-10-02

## 2016-09-25 MED FILL — LISINOPRIL 20 MG TABLET: 20 | 30 days supply | Qty: 30 | Fill #0

## 2016-09-25 MED FILL — ?CLONIDINE HCL 0.1 MG TABL: 0.1 | 30 days supply | Qty: 90 | Fill #3

## 2016-09-25 NOTE — Telephone Encounter (Signed)
Patients BP medication was refilled with a courtesy to obtain PCP appointment. Patients nausea will have to be reevaluated in order to refill Zofran.

## 2016-10-02 ENCOUNTER — Ambulatory Visit: Payer: Self-pay | Attending: Family Medicine | Admitting: Family Medicine

## 2016-10-02 ENCOUNTER — Encounter: Payer: Self-pay | Admitting: Family Medicine

## 2016-10-02 VITALS — BP 150/111 | HR 103 | Temp 98.5°F | Resp 18 | Ht 74.0 in | Wt 198.6 lb

## 2016-10-02 DIAGNOSIS — R112 Nausea with vomiting, unspecified: Secondary | ICD-10-CM | POA: Insufficient documentation

## 2016-10-02 DIAGNOSIS — K860 Alcohol-induced chronic pancreatitis: Secondary | ICD-10-CM | POA: Insufficient documentation

## 2016-10-02 DIAGNOSIS — Z8719 Personal history of other diseases of the digestive system: Secondary | ICD-10-CM

## 2016-10-02 DIAGNOSIS — I10 Essential (primary) hypertension: Secondary | ICD-10-CM | POA: Insufficient documentation

## 2016-10-02 DIAGNOSIS — F1020 Alcohol dependence, uncomplicated: Secondary | ICD-10-CM | POA: Insufficient documentation

## 2016-10-02 DIAGNOSIS — F1010 Alcohol abuse, uncomplicated: Secondary | ICD-10-CM

## 2016-10-02 DIAGNOSIS — R1013 Epigastric pain: Secondary | ICD-10-CM

## 2016-10-02 MED ORDER — CLONIDINE HCL 0.1 MG PO TABS
0.1000 mg | ORAL_TABLET | Freq: Once | ORAL | Status: AC
  Administered 2016-10-02: 0.1 mg via ORAL

## 2016-10-02 MED ORDER — ONDANSETRON HCL 4 MG PO TABS: 4.0000 mg | ORAL_TABLET | Freq: Three times a day (TID) | ORAL | 0 refills | Status: DC | PRN

## 2016-10-02 MED ORDER — LISINOPRIL 40 MG PO TABS: 40.0000 mg | ORAL_TABLET | Freq: Every day | ORAL | 3 refills | Status: DC

## 2016-10-02 MED ORDER — CLONIDINE HCL 0.1 MG PO TABS: 0.1000 mg | ORAL_TABLET | Freq: Three times a day (TID) | ORAL | 3 refills | Status: DC

## 2016-10-02 MED FILL — ?ONDANSETRON HCL 4 MG TABLE: 4 | 7 days supply | Qty: 20 | Fill #0

## 2016-10-02 MED FILL — LISINOPRIL 40 MG TABLET: 40 | 30 days supply | Qty: 30 | Fill #0

## 2016-10-02 NOTE — Patient Instructions (Signed)
If symptoms persists or worsen go to the Emergency Department.  Schedule follow up with PCP on Monday.  Clear Liquid Diet, Adult A clear liquid diet is a diet that includes only liquids that you can see through. You may need to follow a clear liquid diet if:  You develop a medical condition right before or after you have surgery.  You were not able to eat food for a long period of time.  You had a condition that gave you diarrhea.  You are going to have an exam, such as a colonoscopy, in which instruments will be put into your body to look at parts of your digestive system.  You are going to have bowel surgery. The usual goals of this diet are:  To rest the stomach and digestive system as much as possible.  To keep you hydrated.  To make sure you get some calories for energy.  To help you return to normal digestion. Most people need to follow this diet for only a short period of time. What do I need to know about this diet?  A clear liquid is a liquid that you can see through when you hold it up to a light.  A clear liquid diet does not provide all the nutrients that you need. It is important to choose a variety of the liquids that are allowed on this diet. That way, you will get as many nutrients as possible.  If you are not sure whether you can have certain items, ask your health care provider. What can I have?  Water and flavored water.  Fruit juices that do not have pulp, such as cranberry juice and apple juice.  Tea and coffee without milk or cream.  Clear bouillon or broth.  Broth-based soups that have been strained.  Flavored gelatins.  Honey.  Sugar water.  Frozen ice or frozen ice pops that do not contain milk, yogurt, fruit pieces, or fruit pulp.  Clear sodas.  Clear sports drinks. The items listed above may not be a complete list of recommended liquids. Contact your dietitian for more options.  What can I not have?  Juices that have  pulp.  Milk.  Cream or cream-based soups.  Yogurt. The items listed above may not be a complete list of liquids to avoid. Contact your dietitian for more information.  Summary  A clear liquid diet is a diet that includes only liquids that you can see through.  The goal of this diet is to help you recover by resting your digestive system, keeping you hydrated, and providing nutrients.  Make sure to avoid liquids with milk, cream, or pulp while on this diet. This information is not intended to replace advice given to you by your health care provider. Make sure you discuss any questions you have with your health care provider. Document Released: 06/09/2005 Document Revised: 01/22/2016 Document Reviewed: 05/06/2013 Elsevier Interactive Patient Education  2017 [employer].

## 2016-10-02 NOTE — Progress Notes (Signed)
Subjective:  Patient ID: Vincent Price, male    DOB: Dec 19, 1975  Age: 41 y.o. MRN: [id number]  CC: Hypertension   HPI Vincent Price presents for    History of alcoholic pancreatitis: Alcoholism. Reports drinking 3 to 4 shot of Vodka per day. Reports withdrawal symptoms without alcohol use. Reports nausea and vomiting almost every other day. He reports ability to drink fluids. Denies any coffee ground emesis or abdominal swelling. He does report epigastric pain.BP found to be elevated in the office. Reports BP at home SBP 140's DBP 100-130's with adherence to antihypertensive medications.Reports upcoming appointment scheduled with [facility] this week. Denies any SI/HI.   Outpatient Medications Prior to Visit  Medication Sig Dispense Refill  . cyanocobalamin 50 MCG tablet Take 1 tablet (50 mcg total) by mouth daily. 30 tablet 0  . folic acid (FOLVITE) 1 MG tablet Take 1 tablet (1 mg total) by mouth daily. 30 tablet 0  . METHADONE HCL PO Take by mouth. Dose unknown    . cloNIDine (CATAPRES) 0.1 MG tablet Take 1 tablet (0.1 mg total) by mouth 3 (three) times daily. 90 tablet 3  . lisinopril (PRINIVIL,ZESTRIL) 20 MG tablet Take 1 tablet (20 mg total) by mouth daily. 90 tablet 3  . ondansetron (ZOFRAN) 4 MG tablet Take 1 tablet (4 mg total) by mouth every 8 (eight) hours as needed for nausea or vomiting. 20 tablet 0   No facility-administered medications prior to visit.     ROS Review of Systems  Constitutional: Negative.   Respiratory: Negative.   Cardiovascular: Negative.   Gastrointestinal: Positive for abdominal pain, nausea and vomiting.  Psychiatric/Behavioral:       ETOH abuse.    Objective:  BP (!) 150/111   Pulse (!) 103   Temp 98.5 F (36.9 C) (Oral)   Resp 18   Ht '6\' 2"'  (1.88 m)   Wt 198 lb 9.6 oz (90.1 kg)   SpO2 98%   BMI 25.50 kg/m   BP/Weight 10/02/2016 07/04/2016 25/63/8937  Systolic BP 342 876 811  Diastolic BP 572 90 620  Wt. (Lbs) 198.6 - 195  BMI  25.5 - 25.04    Physical Exam  Constitutional: He is oriented to person, place, and time. He appears well-developed and well-nourished.  HENT:  Head: Normocephalic.  Right Ear: External ear normal.  Left Ear: External ear normal.  Nose: Nose normal.  Mouth/Throat: Oropharynx is clear and moist.  Eyes: Conjunctivae are normal. Pupils are equal, round, and reactive to light.  Neck: Normal range of motion. Neck supple. No JVD present.  Cardiovascular: Normal rate, regular rhythm, normal heart sounds and intact distal pulses.   Pulmonary/Chest: Effort normal and breath sounds normal.  Abdominal: Soft. Bowel sounds are normal. There is tenderness (epigastric).  Lymphadenopathy:    He has no cervical adenopathy.  Neurological: He is alert and oriented to person, place, and time.  Skin: Skin is warm and dry.  Nursing note and vitals reviewed.   Assessment & Plan:   Problem List Items Addressed This Visit      Cardiovascular and Mediastinum   Hypertension   -Increase dose of lisinopril to 40 mg QD.    Relevant Medications   cloNIDine (CATAPRES) tablet 0.1 mg (Completed)   cloNIDine (CATAPRES) 0.1 MG tablet   lisinopril (PRINIVIL,ZESTRIL) 40 MG tablet   cloNIDine (CATAPRES) tablet 0.1 mg (Completed)   Other Relevant Orders   CMP14+EGFR (Completed)   Microalbumin/Creatinine Ratio, Urine (Completed)     Other   Alcohol  abuse    Other Visit Diagnoses    History of pancreatitis    -  Primary   -Recommend close follow up. Concern for possible developing pancreatitis.    -Patient and his fiance were given ED precautions if symptoms worsen or fail to improve.    -Instructions given for clear liquid diet.    Relevant Orders   US Abdomen Complete   CMP14+EGFR (Completed)   CBC with Differential (Completed)   Lipase (Completed)   Amylase (Completed)   Epigastric pain       Relevant Orders   US Abdomen Complete   CMP14+EGFR (Completed)   CBC with Differential (Completed)    Lipase (Completed)   Amylase (Completed)   H. pylori breath test (Completed)   H.pylori Breath Test (Reflex) (Completed)   Non-intractable vomiting with nausea, unspecified vomiting type       Relevant Medications   ondansetron (ZOFRAN) 4 MG tablet      Meds ordered this encounter  Medications  . cloNIDine (CATAPRES) tablet 0.1 mg  . cloNIDine (CATAPRES) 0.1 MG tablet    Sig: Take 1 tablet (0.1 mg total) by mouth 3 (three) times daily.    Dispense:  90 tablet    Refill:  3    Order Specific Question:   Supervising Provider    Answer:   Tresa Garter [id number]  . lisinopril (PRINIVIL,ZESTRIL) 40 MG tablet    Sig: Take 1 tablet (40 mg total) by mouth daily.    Dispense:  90 tablet    Refill:  3    Order Specific Question:   Supervising Provider    Answer:   Tresa Garter [id number]  . ondansetron (ZOFRAN) 4 MG tablet    Sig: Take 1 tablet (4 mg total) by mouth every 8 (eight) hours as needed for nausea or vomiting.    Dispense:  20 tablet    Refill:  0    Order Specific Question:   Supervising Provider    Answer:   Tresa Garter [id number]  . cloNIDine (CATAPRES) tablet 0.1 mg    Follow-up: Return in about 4 days (around 10/06/2016) for Pancreatits .   Alfonse Spruce FNP

## 2016-10-02 NOTE — Progress Notes (Signed)
Patient is here for f/up HTN  Patient has taking his bP medication for today  Patient has not eaten for today   Patient denies pain for today  Patient needs refill on Zofran folic acid

## 2016-10-03 LAB — CMP14+EGFR
A/G RATIO: 1.4 (ref 1.2–2.2)
ALK PHOS: 213 IU/L — AB (ref 39–117)
ALT: 75 IU/L — AB (ref 0–44)
AST: 93 IU/L — ABNORMAL HIGH (ref 0–40)
Albumin: 4.5 g/dL (ref 3.5–5.5)
BILIRUBIN TOTAL: 0.7 mg/dL (ref 0.0–1.2)
BUN / CREAT RATIO: 6 — AB (ref 9–20)
BUN: 6 mg/dL (ref 6–20)
CHLORIDE: 96 mmol/L (ref 96–106)
CO2: 21 mmol/L (ref 18–29)
Calcium: 9.6 mg/dL (ref 8.7–10.2)
Creatinine, Ser: 0.96 mg/dL (ref 0.76–1.27)
GFR calc Af Amer: 115 mL/min/{1.73_m2} (ref >59)
GFR calc non Af Amer: 99 mL/min/{1.73_m2} (ref >59)
GLUCOSE: 130 mg/dL — AB (ref 65–99)
Globulin, Total: 3.2 g/dL (ref 1.5–4.5)
POTASSIUM: 4.1 mmol/L (ref 3.5–5.2)
Sodium: 139 mmol/L (ref 134–144)
Total Protein: 7.7 g/dL (ref 6.0–8.5)

## 2016-10-03 LAB — CBC WITH DIFFERENTIAL/PLATELET
BASOS ABS: 0 10*3/uL (ref 0.0–0.2)
Basos: 0 %
EOS (ABSOLUTE): 0.1 10*3/uL (ref 0.0–0.4)
Eos: 2 %
HEMOGLOBIN: 14.3 g/dL (ref 13.0–17.7)
Hematocrit: 43.3 % (ref 37.5–51.0)
Immature Grans (Abs): 0 10*3/uL (ref 0.0–0.1)
Immature Granulocytes: 0 %
LYMPHS ABS: 1.1 10*3/uL (ref 0.7–3.1)
LYMPHS: 19 %
MCH: 35.8 pg — ABNORMAL HIGH (ref 26.6–33.0)
MCHC: 33 g/dL (ref 31.5–35.7)
MCV: 108 fL — ABNORMAL HIGH (ref 79–97)
Monocytes Absolute: 0.4 10*3/uL (ref 0.1–0.9)
Monocytes: 8 %
Neutrophils Absolute: 4 10*3/uL (ref 1.4–7.0)
Neutrophils: 71 %
PLATELETS: 157 10*3/uL (ref 150–379)
RBC: 4 x10E6/uL — AB (ref 4.14–5.80)
RDW: 16.4 % — ABNORMAL HIGH (ref 12.3–15.4)
WBC: 5.6 10*3/uL (ref 3.4–10.8)

## 2016-10-03 LAB — MICROALBUMIN / CREATININE URINE RATIO
Creatinine, Urine: 448.5 mg/dL
Microalb/Creat Ratio: 19.9 mg/g creat (ref 0.0–30.0)
Microalbumin, Urine: 89.2 ug/mL

## 2016-10-03 LAB — AMYLASE: AMYLASE: 54 U/L (ref 31–124)

## 2016-10-03 LAB — LIPASE: LIPASE: 51 U/L (ref 13–78)

## 2016-10-04 LAB — H.PYLORI BREATH TEST (REFLEX): H. PYLORI BREATH TEST: NEGATIVE

## 2016-10-04 LAB — H. PYLORI BREATH TEST

## 2016-10-06 ENCOUNTER — Other Ambulatory Visit: Payer: Self-pay | Admitting: Family Medicine

## 2016-10-06 ENCOUNTER — Telehealth: Payer: Self-pay | Admitting: Family Medicine

## 2016-10-06 NOTE — Telephone Encounter (Signed)
Called patient to follow up. He reports improvement of symptoms since last office visit. He reports last using Zofran 2 days ago. Denies any vomiting. He does report occasional nausea only in the am. He reports better adherence with clonidine and lisinopril. He states " sometimes I was only taking the clonidine once or twice a day or even forgot a day because I would get busy with things like work." He does not have BP cuff at home to monitor BP's. Recommend he keeps log of BP and check 2 to 3 times a week at local pharmacy. Patient wanted to know if he should still keep his follow up appointment. Recommended to patient to keep appointment.

## 2016-10-07 ENCOUNTER — Other Ambulatory Visit: Payer: Self-pay | Admitting: Family Medicine

## 2016-10-07 DIAGNOSIS — D7589 Other specified diseases of blood and blood-forming organs: Secondary | ICD-10-CM

## 2016-10-07 DIAGNOSIS — F1010 Alcohol abuse, uncomplicated: Secondary | ICD-10-CM

## 2016-10-07 MED ORDER — FOLIC ACID 1 MG PO TABS: 1.0000 mg | ORAL_TABLET | Freq: Every day | ORAL | 0 refills | Status: DC

## 2016-10-07 MED ORDER — CYANOCOBALAMIN 50 MCG PO TABS: 50.0000 ug | ORAL_TABLET | Freq: Every day | ORAL | 0 refills | Status: DC

## 2016-10-07 MED FILL — FOLIC ACID 1 MG TABLET: 1 | 30 days supply | Qty: 30 | Fill #0

## 2016-10-07 NOTE — Telephone Encounter (Signed)
-----   Message from Lizbeth Bark, FNP sent at 10/07/2016  4:43 AM EDT ----- -H.pylori is negative. H.pylori is a bacteria that can infect the stomach and cause stomach ulcers.  -Liver function test shows elevations associated with heavy alcohol intake. Higher elevations mean more inflammation and/or damage to liver which is irreversible. Avoid alcohol intake, acetaminophen use, limit intake of fried foods.  -CBC indicates you are deficient in vitamin b and folate. You will be prescribed supplements. -Labs that evaluate for pancreatitis is normal.  -Microalbumin/creatinine ratio level was normal. This tests for protein in your urine that coiuld indicate early signs of kidney damage.

## 2016-10-07 NOTE — Telephone Encounter (Signed)
CMA call patient to go over lab results  Patient Verify DOB  Patient was aware and understood   

## 2016-10-08 ENCOUNTER — Ambulatory Visit: Payer: Self-pay | Admitting: Family Medicine

## 2016-10-10 ENCOUNTER — Ambulatory Visit (HOSPITAL_COMMUNITY)
Admission: RE | Admit: 2016-10-10 | Discharge: 2016-10-10 | Disposition: A | Discharge status: Home / Self Care | Payer: Self-pay | Source: Ambulatory Visit | Attending: Family Medicine | Admitting: Family Medicine

## 2016-10-10 DIAGNOSIS — R1013 Epigastric pain: Secondary | ICD-10-CM

## 2016-10-10 DIAGNOSIS — Z8719 Personal history of other diseases of the digestive system: Secondary | ICD-10-CM

## 2016-10-13 ENCOUNTER — Ambulatory Visit (HOSPITAL_COMMUNITY)
Admission: RE | Admit: 2016-10-13 | Discharge: 2016-10-13 | Disposition: A | Discharge status: Home / Self Care | Payer: Self-pay | Source: Ambulatory Visit | Attending: Family Medicine | Admitting: Family Medicine

## 2016-10-13 ENCOUNTER — Encounter: Payer: Self-pay | Admitting: Family Medicine

## 2016-10-13 ENCOUNTER — Ambulatory Visit: Payer: Self-pay | Attending: Family Medicine | Admitting: Family Medicine

## 2016-10-13 VITALS — BP 144/105 | HR 73 | Temp 98.3°F | Resp 18 | Ht 74.0 in | Wt 203.0 lb

## 2016-10-13 DIAGNOSIS — K859 Acute pancreatitis without necrosis or infection, unspecified: Secondary | ICD-10-CM | POA: Insufficient documentation

## 2016-10-13 DIAGNOSIS — R1013 Epigastric pain: Secondary | ICD-10-CM | POA: Insufficient documentation

## 2016-10-13 DIAGNOSIS — K700 Alcoholic fatty liver: Secondary | ICD-10-CM

## 2016-10-13 DIAGNOSIS — F1010 Alcohol abuse, uncomplicated: Secondary | ICD-10-CM

## 2016-10-13 DIAGNOSIS — F1020 Alcohol dependence, uncomplicated: Secondary | ICD-10-CM | POA: Insufficient documentation

## 2016-10-13 DIAGNOSIS — K838 Other specified diseases of biliary tract: Secondary | ICD-10-CM | POA: Insufficient documentation

## 2016-10-13 DIAGNOSIS — Z8719 Personal history of other diseases of the digestive system: Secondary | ICD-10-CM | POA: Insufficient documentation

## 2016-10-13 DIAGNOSIS — R110 Nausea: Secondary | ICD-10-CM

## 2016-10-13 DIAGNOSIS — K760 Fatty (change of) liver, not elsewhere classified: Secondary | ICD-10-CM | POA: Insufficient documentation

## 2016-10-13 DIAGNOSIS — R030 Elevated blood-pressure reading, without diagnosis of hypertension: Secondary | ICD-10-CM | POA: Insufficient documentation

## 2016-10-13 MED ORDER — ONDANSETRON HCL 4 MG PO TABS: 4.0000 mg | ORAL_TABLET | Freq: Three times a day (TID) | ORAL | 0 refills | Status: DC | PRN

## 2016-10-13 NOTE — Progress Notes (Signed)
Patient is here for f/up pancreatitis  Patient has taking his medication   Patient has eaten for today  Patient denies pain for today

## 2016-10-13 NOTE — Patient Instructions (Addendum)
Schedule follow up with PCP in 8 weeks.  Follow up with counseling/ substance use resources. Follow up with GI referral.  Alcohol Use Disorder Alcohol use disorder is when your drinking disrupts your daily life. When you have this condition, you drink too much alcohol and you cannot control your drinking. Alcohol use disorder can cause serious problems with your physical health. It can affect your brain, heart, liver, pancreas, immune system, stomach, and intestines. Alcohol use disorder can increase your risk for certain cancers and cause problems with your mental health, such as depression, anxiety, psychosis, delirium, and dementia. People with this disorder risk hurting themselves and others. What are the causes? This condition is caused by drinking too much alcohol over time. It is not caused by drinking too much alcohol only one or two times. Some people with this condition drink alcohol to cope with or escape from negative life events. Others drink to relieve pain or symptoms of mental illness. What increases the risk? You are more likely to develop this condition if:  You have a family history of alcohol use disorder.  Your culture encourages drinking to the point of intoxication, or makes alcohol easy to get.  You had a mood or conduct disorder in childhood.  You have been a victim of abuse.  You are an adolescent and:  You have poor grades or difficulties in school.  Your caregivers do not talk to you about saying no to alcohol, or supervise your activities.  You are impulsive or you have trouble with self-control. What are the signs or symptoms? Symptoms of this condition include:  Drinkingmore than you want to.  Drinking for longer than you want to.  Trying several times to drink less or to control your drinking.  Spending a lot of time getting alcohol, drinking, or recovering from drinking.  Craving alcohol.  Having problems at work, at school, or at home due to  drinking.  Having problems in relationships due to drinking.  Drinking when it is dangerous to drink, such as before driving a car.  Continuing to drink even though you know you might have a physical or mental problem related to drinking.  Needing more and more alcohol to get the same effect you want from the alcohol (building up tolerance).  Having symptoms of withdrawal when you stop drinking. Symptoms of withdrawal include:  Fatigue.  Nightmares.  Trouble sleeping.  Depression.  Anxiety.  Fever.  Seizures.  Severe confusion.  Feeling or seeing things that are not there (hallucinations).  Tremors.  Rapid heart rate.  Rapid breathing.  High blood pressure.  Drinking to avoid symptoms of withdrawal. How is this diagnosed? This condition is diagnosed with an assessment. Your health care provider may start the assessment by asking three or four questions about your drinking. Your health care provider may perform a physical exam or do lab tests to see if you have physical problems resulting from alcohol use. She or he may refer you to a mental health professional for evaluation. How is this treated? Some people with alcohol use disorder are able to reduce their alcohol use to low-risk levels. Others need to completely quit drinking alcohol. When necessary, mental health professionals with specialized training in substance use treatment can help. Your health care provider can help you decide how severe your alcohol use disorder is and what type of treatment you need. The following forms of treatment are available:  Detoxification. Detoxification involves quitting drinking and using prescription medicines within the first  week to help lessen withdrawal symptoms. This treatment is important for people who have had withdrawal symptoms before and for heavy drinkers who are likely to have withdrawal symptoms. Alcohol withdrawal can be dangerous, and in severe cases, it can cause  death. Detoxification may be provided in a home, community, or primary care setting, or in a hospital or substance use treatment facility.  Counseling. This treatment is also called talk therapy. It is provided by substance use treatment counselors. A counselor can address the reasons you use alcohol and suggest ways to keep you from drinking again or to prevent problem drinking. The goals of talk therapy are to:  Find healthy activities and ways for you to cope with stress.  Identify and avoid the things that trigger your alcohol use.  Help you learn how to handle cravings.  Medicines.Medicines can help treat alcohol use disorder by:  Decreasing alcohol cravings.  Decreasing the positive feeling you have when you drink alcohol.  Causing an uncomfortable physical reaction when you drink alcohol (aversion therapy).  Support groups. Support groups are led by people who have quit drinking. They provide emotional support, advice, and guidance. These forms of treatment are often combined. Some people with this condition benefit from a combination of treatments provided by specialized substance use treatment centers. Follow these instructions at home:  Take over-the-counter and prescription medicines only as told by your health care provider.  Check with your health care provider before starting any new medicines.  Ask friends and family members not to offer you alcohol.  Avoid situations where alcohol is served, including gatherings where others are drinking alcohol.  Create a plan for what to do when you are tempted to use alcohol.  Find hobbies or activities that you enjoy that do not include alcohol.  Keep all follow-up visits as told by your health care provider. This is important. How is this prevented?  If you drink, limit alcohol intake to no more than 1 drink a day for nonpregnant women and 2 drinks a day for men. One drink equals 12 oz of beer, 5 oz of wine, or 1 oz of hard  liquor.  If you have a mental health condition, get treatment and support.  Do not give alcohol to adolescents.  If you are an adolescent:  Do not drink alcohol.  Do not be afraid to say no if someone offers you alcohol. Speak up about why you do not want to drink. You can be a positive role model for your friends and set a good example for those around you by not drinking alcohol.  If your friends drink, spend time with others who do not drink alcohol. Make new friends who do not use alcohol.  Find healthy ways to manage stress and emotions, such as meditation or deep breathing, exercise, spending time in nature, listening to music, or talking with a trusted friend or family member. Contact a health care provider if:  You are not able to take your medicines as told.  Your symptoms get worse.  You return to drinking alcohol (relapse) and your symptoms get worse. Get help right away if:  You have thoughts about hurting yourself or others. If you ever feel like you may hurt yourself or others, or have thoughts about taking your own life, get help right away. You can go to your nearest emergency department or call:  Your local emergency services (911 in the [place]  A suicide crisis helpline, such as the National Suicide Prevention  Lifeline at 279-740-9528. This is open 24 hours a day. Summary  Alcohol use disorder is when your drinking disrupts your daily life. When you have this condition, you drink too much alcohol and you cannot control your drinking.  Treatment may include detoxification, counseling, medicine, and support groups.  Ask friends and family members not to offer you alcohol. Avoid situations where alcohol is served.  Get help right away if you have thoughts about hurting yourself or others. This information is not intended to replace advice given to you by your health care provider. Make sure you discuss any questions you have with your health care  provider. Document Released: 07/17/2004 Document Revised: 03/06/2016 Document Reviewed: 03/06/2016 Elsevier Interactive Patient Education  2017 [employer].

## 2016-10-13 NOTE — Progress Notes (Signed)
Subjective:  Patient ID: Vincent Price, male    DOB: March 02, 1976  Age: 41 y.o. MRN: [id number]  CC: Establish Care   HPI Vincent Price presents for   Pancreatitis follow-up: Reports symptoms have greatly improved. Last episode of N/V 3 to 4 days ago. Reports only eating one meal per day due to poor appetite. Continues to drink alcohol. Reports drinking 4 to 5 shots of vodka per day. Has not followed up with [facility] yet, reports that he intends to. When asked about inpatient vs. outpatient services he declines impatient services at this time. Denies any SI/HI. Reports    Outpatient Medications Prior to Visit  Medication Sig Dispense Refill  . cloNIDine (CATAPRES) 0.1 MG tablet Take 1 tablet (0.1 mg total) by mouth 3 (three) times daily. 90 tablet 3  . cyanocobalamin 50 MCG tablet Take 1 tablet (50 mcg total) by mouth daily. 90 tablet 0  . folic acid (FOLVITE) 1 MG tablet Take 1 tablet (1 mg total) by mouth daily. 90 tablet 0  . lisinopril (PRINIVIL,ZESTRIL) 40 MG tablet Take 1 tablet (40 mg total) by mouth daily. 90 tablet 3  . METHADONE HCL PO Take by mouth. Dose unknown    . ondansetron (ZOFRAN) 4 MG tablet Take 1 tablet (4 mg total) by mouth every 8 (eight) hours as needed for nausea or vomiting. 20 tablet 0   No facility-administered medications prior to visit.     ROS Review of Systems  Constitutional: Negative.   Respiratory: Negative.   Cardiovascular: Negative.   Gastrointestinal: Negative.   Skin: Negative.   Psychiatric/Behavioral: Positive for behavioral problems (alcoholism).   Objective:  BP (!) 144/105 (BP Location: Left Arm, Patient Position: Sitting, Cuff Size: Normal)   Pulse 73   Temp 98.3 F (36.8 C) (Oral)   Resp 18   Ht  (1.88 m)   Wt 203 lb (92.1 kg)   SpO2 99%   BMI 26.06 kg/m   BP/Weight 10/13/2016 10/02/2016 07/04/2016  Systolic BP 144 150 133  Diastolic BP 105 111 90  Wt. (Lbs) 203 198.6 -  BMI 26.06 25.5 -    Physical Exam    Eyes: Conjunctivae are normal. Pupils are equal, round, and reactive to light.  Neck: No JVD present.  Cardiovascular: Normal rate, regular rhythm, normal heart sounds and intact distal pulses.   Pulmonary/Chest: Effort normal and breath sounds normal.  Abdominal: Soft. Bowel sounds are normal. There is no tenderness.  Skin: Skin is warm and dry.  Psychiatric: He expresses no homicidal and no suicidal ideation. He expresses no suicidal plans and no homicidal plans.  Nursing note and vitals reviewed.  Assessment & Plan:   Problem List Items Addressed This Visit       -BP elevated in office visit. Patient reports not taking lisinopril for today. Recommend recheck of BP.    - Schedule BP recheck in 2 weeks with nurse.    -If BP is greater than 90/60 (MAP 65 or greater) but not less than 130/80 may amlodipine 2.5 mg QD   and recheck in another 2 weeks.    Digestive   Alcohol induced fatty liver   Relevant Orders   Ambulatory referral to Gastroenterology     Other   Alcohol abuse - Primary       -Given resources for counseling and alcoholism.   Other Visit Diagnoses    History of pancreatitis       Relevant Orders   Ambulatory referral to Gastroenterology  Nausea       Relevant Medications   ondansetron (ZOFRAN) 4 MG tablet      Meds ordered this encounter  Medications  . ondansetron (ZOFRAN) 4 MG tablet    Sig: Take 1 tablet (4 mg total) by mouth every 8 (eight) hours as needed for nausea or vomiting.    Dispense:  20 tablet    Refill:  0    Order Specific Question:   Supervising Provider    Answer:   Quentin Angst [[id number]]    Follow-up: Return in about 2 weeks (around 10/27/2016) for BP check with clinic RN. Lizbeth Bark FNP

## 2016-10-31 MED FILL — LISINOPRIL 40 MG TABLET: 40 | 30 days supply | Qty: 30 | Fill #1

## 2016-10-31 MED FILL — ?CLONIDINE HCL 0.1 MG TABL: 0.1 | 30 days supply | Qty: 90 | Fill #0

## 2016-10-31 MED FILL — ?ONDANSETRON HCL 4 MG TABLE: 4 | 6 days supply | Qty: 20 | Fill #0

## 2016-11-27 ENCOUNTER — Encounter: Payer: Self-pay | Admitting: Family Medicine

## 2016-12-11 ENCOUNTER — Ambulatory Visit: Payer: Self-pay | Admitting: Family Medicine

## 2016-12-12 MED FILL — cloNIDine HCL 0.1 MG TABS: 0.1 | 30 days supply | Qty: 90 | Fill #1

## 2016-12-12 MED FILL — LISINOPRIL 40 MG TABLET: 40 | 30 days supply | Qty: 30 | Fill #2

## 2017-01-05 DIAGNOSIS — I10 Essential (primary) hypertension: Secondary | ICD-10-CM

## 2017-01-05 DIAGNOSIS — F10239 Alcohol dependence with withdrawal, unspecified: Secondary | ICD-10-CM

## 2017-01-05 DIAGNOSIS — K759 Inflammatory liver disease, unspecified: Secondary | ICD-10-CM

## 2017-01-13 ENCOUNTER — Ambulatory Visit: Payer: Self-pay | Attending: Family Medicine | Admitting: Physician Assistant

## 2017-01-13 VITALS — BP 157/123 | HR 98 | Temp 98.4°F | Resp 20 | Ht 74.0 in | Wt 200.2 lb

## 2017-01-13 DIAGNOSIS — R768 Other specified abnormal immunological findings in serum: Secondary | ICD-10-CM

## 2017-01-13 DIAGNOSIS — I10 Essential (primary) hypertension: Secondary | ICD-10-CM | POA: Insufficient documentation

## 2017-01-13 DIAGNOSIS — R112 Nausea with vomiting, unspecified: Secondary | ICD-10-CM

## 2017-01-13 DIAGNOSIS — R251 Tremor, unspecified: Secondary | ICD-10-CM | POA: Insufficient documentation

## 2017-01-13 DIAGNOSIS — K859 Acute pancreatitis without necrosis or infection, unspecified: Secondary | ICD-10-CM | POA: Insufficient documentation

## 2017-01-13 DIAGNOSIS — B1920 Unspecified viral hepatitis C without hepatic coma: Secondary | ICD-10-CM | POA: Insufficient documentation

## 2017-01-13 DIAGNOSIS — Z79899 Other long term (current) drug therapy: Secondary | ICD-10-CM | POA: Insufficient documentation

## 2017-01-13 MED FILL — ?CLONIDINE HCL 0.1 MG TABL: 0.1 | 30 days supply | Qty: 90 | Fill #2

## 2017-01-13 MED FILL — LISINOPRIL 40 MG TABLET: 40 | 30 days supply | Qty: 30 | Fill #3

## 2017-01-13 NOTE — Progress Notes (Signed)
N/V

## 2017-01-13 NOTE — Progress Notes (Signed)
Chief Complaint: "hospital follow up"  Subjective: This is a 41 year old male with a history of alcohol abuse, pancreatitis, smoking and hypertension who was recently hospitalized at an outside hospital after experiencing vomiting and tremors. He states that they felt like he was withdrawn from alcohol but he doesn't feel the same. He was given IV fluids. Multiple test was done including an hep C which reportedly came back positive. I do not have any records from this hospitalization. This is all coming from the patient and his significant other. Since discharge his tremors have improved. No more nausea or vomiting. No abdominal pain. He didn't drink for the first 48 hours after release but has had some vodka shots since this time. Has been encouraged/refered to [facility] in past but pt his reluctant to go. He continues to smoke. Concerned about his hepatitis C potential diagnosis. Intermittently compliant with his blood pressure medications. Only took his blood pressure medicines 30 minutes prior to arrival.   ROS:  GEN: denies fever or chills, denies change in weight Skin: denies lesions or rashes HEENT: denies headache, earache, epistaxis, sore throat, or neck pain LUNGS: denies SHOB, dyspnea, PND, orthopnea CV: denies CP or palpitations ABD: denies abd pain, N or V EXT: denies muscle spasms or swelling; no pain in lower ext, no weakness NEURO: denies numbness or tingling, denies sz, stroke or TIA   Objective:  Vitals:   01/13/17 1049  BP: (!) 157/123  Pulse: 98  Resp: 20  Temp: 98.4 F (36.9 C)  TempSrc: Oral  SpO2: 98%  Weight: 200 lb 3.2 oz (90.8 kg)  Height: 6\' 2"  (1.88 m)    Physical Exam:benign  General: in no acute distress. HEENT: no pallor, no icterus, moist oral mucosa, no JVD, no lymphadenopathy Heart: Normal  s1 &s2  Regular rate and rhythm, without murmurs, rubs, gallops. Lungs: Clear to auscultation bilaterally. Abdomen: Soft, nontender, nondistended, positive  bowel sounds. Extremities: No clubbing cyanosis or edema with positive pedal pulses. Neuro: Alert, awake, oriented x3, nonfocal.   Medications: Prior to Admission medications   Medication Sig Start Date End Date Taking? Authorizing Provider  cloNIDine (CATAPRES) 0.1 MG tablet Take 1 tablet (0.1 mg total) by mouth 3 (three) times daily. 10/02/16  Yes Hairston, Oren BeckmannMandesia R, FNP  cyanocobalamin 50 MCG tablet Take 1 tablet (50 mcg total) by mouth daily. 10/07/16  Yes Hairston, Oren BeckmannMandesia R, FNP  folic acid (FOLVITE) 1 MG tablet Take 1 tablet (1 mg total) by mouth daily. 10/07/16  Yes Hairston, Mandesia R, FNP  lisinopril (PRINIVIL,ZESTRIL) 40 MG tablet Take 1 tablet (40 mg total) by mouth daily. 10/02/16  Yes Hairston, Mandesia R, FNP  METHADONE HCL PO Take by mouth. Dose unknown   Yes [provider]  ondansetron (ZOFRAN) 4 MG tablet Take 1 tablet (4 mg total) by mouth every 8 (eight) hours as needed for nausea or vomiting. 10/13/16  Yes Lizbeth BarkHairston, Mandesia R, FNP    Assessment: 1. Hep C AB + (reported) 2. ETOH abuse 3. Smoker 4. Hx pancreatitis  Plan: CBC, CMP, amylase, lipase Hep C RNA GI referral  DASH diet No adjustment in BP meds, compliance encourage ETOH cessation and encouragement to go to [facility] counselor appt  Follow up:2 weeks  The patient was given clear instructions to go to ER or return to medical center if symptoms don't improve, worsen or new problems develop. The patient verbalized understanding. The patient was told to call to get lab results if they haven't heard anything in the  next week.   This note has been created with Education officer, environmental. Any transcriptional errors are unintentional.   Scot Jun, PA-C 01/13/2017, 11:18 AM

## 2017-01-15 LAB — CBC WITH DIFFERENTIAL/PLATELET
BASOS ABS: 0 10*3/uL (ref 0.0–0.2)
BASOS: 1 %
EOS (ABSOLUTE): 0.2 10*3/uL (ref 0.0–0.4)
Eos: 3 %
Hematocrit: 44.2 % (ref 37.5–51.0)
Hemoglobin: 14.8 g/dL (ref 13.0–17.7)
IMMATURE GRANULOCYTES: 0 %
Immature Grans (Abs): 0 10*3/uL (ref 0.0–0.1)
LYMPHS: 22 %
Lymphocytes Absolute: 1.2 10*3/uL (ref 0.7–3.1)
MCH: 36.2 pg — ABNORMAL HIGH (ref 26.6–33.0)
MCHC: 33.5 g/dL (ref 31.5–35.7)
MCV: 108 fL — ABNORMAL HIGH (ref 79–97)
Monocytes Absolute: 0.6 10*3/uL (ref 0.1–0.9)
Monocytes: 11 %
NEUTROS PCT: 63 %
Neutrophils Absolute: 3.6 10*3/uL (ref 1.4–7.0)
Platelets: 271 10*3/uL (ref 150–379)
RBC: 4.09 x10E6/uL — AB (ref 4.14–5.80)
RDW: 14.4 % (ref 12.3–15.4)
WBC: 5.7 10*3/uL (ref 3.4–10.8)

## 2017-01-15 LAB — HCV RNA (INTERNATIONAL UNITS)
HCV LOG10: 7.452 {Log_IU}/mL
HCV RNA (INTERNATIONAL UNITS): 28300000 [IU]/mL

## 2017-01-15 LAB — LIPASE: LIPASE: 49 U/L (ref 13–78)

## 2017-01-15 LAB — AMYLASE: Amylase: 52 U/L (ref 31–124)

## 2017-01-15 LAB — HCV RNA QUANT

## 2017-01-21 ENCOUNTER — Telehealth: Payer: Self-pay | Admitting: Family Medicine

## 2017-01-21 NOTE — Telephone Encounter (Signed)
Pt called to request the lab result, please call back

## 2017-01-26 NOTE — Telephone Encounter (Signed)
CMA call regarding lab results   Patient did not answer & no VM set up to leave message   

## 2017-01-29 ENCOUNTER — Encounter: Payer: Self-pay | Admitting: Family Medicine

## 2017-01-29 ENCOUNTER — Ambulatory Visit: Payer: Self-pay | Attending: Family Medicine | Admitting: Family Medicine

## 2017-01-29 VITALS — BP 115/80 | HR 106 | Temp 98.4°F | Resp 18 | Ht 74.0 in | Wt 205.4 lb

## 2017-01-29 DIAGNOSIS — F1021 Alcohol dependence, in remission: Secondary | ICD-10-CM

## 2017-01-29 DIAGNOSIS — Z23 Encounter for immunization: Secondary | ICD-10-CM

## 2017-01-29 DIAGNOSIS — Z79899 Other long term (current) drug therapy: Secondary | ICD-10-CM | POA: Insufficient documentation

## 2017-01-29 DIAGNOSIS — I10 Essential (primary) hypertension: Secondary | ICD-10-CM | POA: Insufficient documentation

## 2017-01-29 DIAGNOSIS — B1920 Unspecified viral hepatitis C without hepatic coma: Secondary | ICD-10-CM | POA: Insufficient documentation

## 2017-01-29 DIAGNOSIS — Z8619 Personal history of other infectious and parasitic diseases: Secondary | ICD-10-CM

## 2017-01-29 DIAGNOSIS — D7589 Other specified diseases of blood and blood-forming organs: Secondary | ICD-10-CM | POA: Insufficient documentation

## 2017-01-29 MED ORDER — LISINOPRIL 40 MG PO TABS: 40.0000 mg | ORAL_TABLET | Freq: Every day | ORAL | 3 refills | Status: DC

## 2017-01-29 MED ORDER — CLONIDINE HCL 0.1 MG PO TABS: 0.1000 mg | ORAL_TABLET | Freq: Three times a day (TID) | ORAL | 3 refills | Status: DC

## 2017-01-29 MED ORDER — CYANOCOBALAMIN 50 MCG PO TABS: 50.0000 ug | ORAL_TABLET | Freq: Every day | ORAL | 3 refills | Status: DC

## 2017-01-29 MED ORDER — FOLIC ACID 1 MG PO TABS: 1.0000 mg | ORAL_TABLET | Freq: Every day | ORAL | 3 refills | Status: DC

## 2017-01-29 NOTE — Patient Instructions (Signed)
Please [facility] concerning positive HEP C.  [phone]   Hepatitis C Hepatitis C is a viral infection of the liver. It can lead to scarring of the liver (cirrhosis), liver failure, or liver cancer. Hepatitis C may go undetected for months or years because people with the infection may not have symptoms, or they may have only mild symptoms. What are the causes? Hepatitis C is caused by the hepatitis C virus (HCV). The virus can be passed from one person to another through:  Blood.  Contaminated needles, such as those used for tattooing, body piercing, acupuncture, or injecting drugs.  Having unprotected sex with an infected person.  Childbirth.  Blood transfusions or organ transplants done in the [place] before 1992.  What increases the risk? Risk factors for hepatitis C include:  Having unprotected sex with an infected person.  Using illegal drugs.  What are the signs or symptoms? Symptoms of hepatitis C may include:  Fatigue.  Loss of appetite.  Nausea.  Vomiting.  Abdominal pain.  Dark yellow urine.  Yellowish skin and eyes (jaundice).  Itching of the skin.  Clay-colored bowel movements.  Joint pain.  Symptoms are not always present. How is this diagnosed? Hepatitis C is diagnosed with blood tests. Other types of tests may also be done to check how your liver is functioning. How is this treated? Your health care provider may perform noninvasive tests or a liver biopsy to help determine the best course of treatment. Treatment for hepatitis C may include one or more medicines. Your health care provider may check you for a recurring infection or other liver conditions every 6-12 months after treatment. Follow these instructions at home:  Rest as needed.  Take all medicines as directed by your health care provider.  Do not take any medicine unless approved by your health care provider. This includes  over-the-counter medicine and birth control pills.  Do not drink alcohol.  Do not have sex until approved by your health care provider.  Do not share toothbrushes, nail clippers, razors, or needles. How is this prevented? There is no vaccine for hepatitis C. The only way to prevent the disease is to reduce the risk of exposure to the virus. This may be done by:  Practicing safe sex and using condoms.  Avoiding illegal drugs.  Contact a health care provider if:  You have a fever.  You develop abdominal pain.  You develop dark urine.  You have clay-colored bowel movements.  You develop joint pains. Get help right away if:  You have increasing fatigue or weakness.  You lose your appetite.  You feel nauseous or vomit.  You develop jaundice or your jaundice gets worse.  You bruise or bleed easily. This information is not intended to replace advice given to you by your health care provider. Make sure you discuss any questions you have with your health care provider. Document Released: 06/06/2000 Document Revised: 11/15/2015 Document Reviewed: 09/21/2013 Elsevier Interactive Patient Education  2017 [employer]

## 2017-01-29 NOTE — Progress Notes (Signed)
Patient is here for f/up  

## 2017-01-29 NOTE — Progress Notes (Signed)
Subjective:  Patient ID: Vincent BranchWilliam Walck, male    DOB: 04-26-1976  Age: 41 y.o. MRN: [id number]  CC: Hypertension   HPI Vincent Price presents for history of HTN. He is not exercising and is not adherent to low salt diet.  He does not check BP at home. Cardiac symptoms none. Patient denies chest pain, chest pressure/discomfort, claudication, dyspnea, lower extremity edema, near-syncope, palpitations and syncope.  Cardiovascular risk factors: hypertension, male gender and sedentary lifestyle. Use of agents associated with hypertension: alcohol. History of target organ damage: fatty liver disease and pancreatitis. History of alcoholism with pancreatis. He denies any N/V. He reports decreasing alcohol use. [facility]. He denies any SI/HI use. Recent diagnosis of Hepatitis C at health department. He denies any IV drug use.   Outpatient Medications Prior to Visit  Medication Sig Dispense Refill  . METHADONE HCL PO Take by mouth. Dose unknown    . cloNIDine (CATAPRES) 0.1 MG tablet Take 1 tablet (0.1 mg total) by mouth 3 (three) times daily. 90 tablet 3  . cyanocobalamin 50 MCG tablet Take 1 tablet (50 mcg total) by mouth daily. 90 tablet 0  . lisinopril (PRINIVIL,ZESTRIL) 40 MG tablet Take 1 tablet (40 mg total) by mouth daily. 90 tablet 3  . ondansetron (ZOFRAN) 4 MG tablet Take 1 tablet (4 mg total) by mouth every 8 (eight) hours as needed for nausea or vomiting. 20 tablet 0  . folic acid (FOLVITE) 1 MG tablet Take 1 tablet (1 mg total) by mouth daily. 90 tablet 0   No facility-administered medications prior to visit.     ROS Review of Systems  Constitutional: Negative.   Eyes: Negative.   Respiratory: Negative.   Cardiovascular: Negative.   Gastrointestinal: Negative.   Skin: Negative.   Neurological: Negative.   Psychiatric/Behavioral: Positive for behavioral problems (alcoholism). Negative for suicidal ideas.    Objective:  BP 115/80   Pulse  (!) 106   Temp 98.4 F (36.9 C) (Oral)   Resp 18   Ht 6\' 2"  (1.88 m)   Wt 205 lb 6.4 oz (93.2 kg)   SpO2 97%   BMI 26.37 kg/m   BP/Weight 01/29/2017 01/13/2017 10/13/2016  Systolic BP 115 157 144  Diastolic BP 80 123 105  Wt. (Lbs) 205.4 200.2 203  BMI 26.37 25.7 26.06     Physical Exam  Constitutional: He is oriented to person, place, and time. He appears well-developed and well-nourished.  HENT:  Head: Normocephalic and atraumatic.  Right Ear: External ear normal.  Left Ear: External ear normal.  Nose: Nose normal.  Mouth/Throat: Oropharynx is clear and moist.  Eyes: Pupils are equal, round, and reactive to light. Conjunctivae are normal. No scleral icterus.  Neck: No JVD present.  Cardiovascular: Normal rate, regular rhythm, normal heart sounds and intact distal pulses.   Pulmonary/Chest: Effort normal and breath sounds normal.  Abdominal: Soft. Bowel sounds are normal. There is no tenderness.  Neurological: He is alert and oriented to person, place, and time.  Skin: Skin is warm and dry.  Psychiatric: He has a normal mood and affect.  Nursing note and vitals reviewed.  Assessment & Plan:   Problem List Items Addressed This Visit      Cardiovascular and Mediastinum   Hypertension - Primary   BP well controlled on current medications   Relevant Medications   cloNIDine (CATAPRES) 0.1 MG tablet   lisinopril (PRINIVIL,ZESTRIL) 40 MG tablet     Other   Macrocytosis without  anemia   Relevant Medications   cyanocobalamin 50 MCG tablet   folic acid (FOLVITE) 1 MG tablet    Other Visit Diagnoses    History of hepatitis C       Patient encouraged to follow up with health department. Patient does is uninsured and does not qualify for   [facility] card due to being a non-[place] resident.   History of alcohol dependence (HCC)       Relevant Medications   cyanocobalamin 50 MCG tablet   folic acid (FOLVITE) 1 MG tablet      Meds ordered this encounter    Medications  . cloNIDine (CATAPRES) 0.1 MG tablet    Sig: Take 1 tablet (0.1 mg total) by mouth 3 (three) times daily.    Dispense:  90 tablet    Refill:  3    Must have office visit for refills.    Order Specific Question:   Supervising Provider    Answer:   Quentin Angst [id number]  . lisinopril (PRINIVIL,ZESTRIL) 40 MG tablet    Sig: Take 1 tablet (40 mg total) by mouth daily.    Dispense:  90 tablet    Refill:  3    Must have office visit for refills.    Order Specific Question:   Supervising Provider    Answer:   Quentin Angst [id number]  . cyanocobalamin 50 MCG tablet    Sig: Take 1 tablet (50 mcg total) by mouth daily.    Dispense:  90 tablet    Refill:  3    Order Specific Question:   Supervising Provider    Answer:   Quentin Angst [id number]  . folic acid (FOLVITE) 1 MG tablet    Sig: Take 1 tablet (1 mg total) by mouth daily.    Dispense:  90 tablet    Refill:  3    Order Specific Question:   Supervising Provider    Answer:   Quentin Angst [id number]    Follow-up: Return in about 3 months (around 05/01/2017) for HTN.   Lizbeth Bark FNP

## 2017-02-11 MED FILL — FOLIC ACID 1 MG TABLET: 1 | 30 days supply | Qty: 30 | Fill #0

## 2017-02-11 MED FILL — ?CLONIDINE HCL 0.1 MG TABL: 0.1 | 30 days supply | Qty: 90 | Fill #0

## 2017-02-11 MED FILL — LISINOPRIL 40 MG TABLET: 40 | 30 days supply | Qty: 30 | Fill #0

## 2017-03-11 MED FILL — FOLIC ACID 1 MG TABLET: 1 | 30 days supply | Qty: 30 | Fill #1

## 2017-03-11 MED FILL — cloNIDine HCL 0.1 MG TABS: 0.1 | 30 days supply | Qty: 90 | Fill #1

## 2017-03-11 MED FILL — LISINOPRIL 40 MG TABLET: 40 | 30 days supply | Qty: 30 | Fill #1

## 2017-04-14 MED FILL — cloNIDine HCL 0.1 MG TABS: 0.1 | 30 days supply | Qty: 90 | Fill #2

## 2017-04-14 MED FILL — FOLIC ACID 1 MG TABLET: 1 | 30 days supply | Qty: 30 | Fill #2

## 2017-04-14 MED FILL — LISINOPRIL 40 MG TABLET: 40 | 30 days supply | Qty: 30 | Fill #2

## 2017-05-01 ENCOUNTER — Ambulatory Visit: Payer: Self-pay | Admitting: Family Medicine

## 2017-07-02 ENCOUNTER — Other Ambulatory Visit: Payer: Self-pay | Admitting: Family Medicine

## 2017-07-02 DIAGNOSIS — I10 Essential (primary) hypertension: Secondary | ICD-10-CM

## 2017-07-02 MED ORDER — CLONIDINE HCL 0.1 MG PO TABS: 0.1000 mg | ORAL_TABLET | Freq: Three times a day (TID) | ORAL | 0 refills | Status: DC

## 2017-07-02 MED ORDER — LISINOPRIL 40 MG PO TABS: 40.0000 mg | ORAL_TABLET | Freq: Every day | ORAL | 0 refills | Status: DC

## 2017-07-02 MED FILL — cloNIDine HCL 0.1 MG TABS: 0.1 | 30 days supply | Qty: 90 | Fill #3

## 2017-07-02 MED FILL — LISINOPRIL 40 MG TAB: 40 | 30 days supply | Qty: 30 | Fill #3

## 2017-07-06 ENCOUNTER — Encounter: Payer: Self-pay | Admitting: Family Medicine

## 2017-07-06 ENCOUNTER — Ambulatory Visit: Payer: Self-pay | Attending: Family Medicine | Admitting: Family Medicine

## 2017-07-06 VITALS — BP 137/83 | HR 104 | Temp 99.3°F | Resp 18 | Ht 73.0 in | Wt 212.0 lb

## 2017-07-06 DIAGNOSIS — R252 Cramp and spasm: Secondary | ICD-10-CM | POA: Insufficient documentation

## 2017-07-06 DIAGNOSIS — Z79899 Other long term (current) drug therapy: Secondary | ICD-10-CM | POA: Insufficient documentation

## 2017-07-06 DIAGNOSIS — Z9111 Patient's noncompliance with dietary regimen: Secondary | ICD-10-CM | POA: Insufficient documentation

## 2017-07-06 DIAGNOSIS — Z9119 Patient's noncompliance with other medical treatment and regimen: Secondary | ICD-10-CM | POA: Insufficient documentation

## 2017-07-06 DIAGNOSIS — K760 Fatty (change of) liver, not elsewhere classified: Secondary | ICD-10-CM | POA: Insufficient documentation

## 2017-07-06 DIAGNOSIS — F1011 Alcohol abuse, in remission: Secondary | ICD-10-CM

## 2017-07-06 DIAGNOSIS — B1920 Unspecified viral hepatitis C without hepatic coma: Secondary | ICD-10-CM | POA: Insufficient documentation

## 2017-07-06 DIAGNOSIS — G4762 Sleep related leg cramps: Secondary | ICD-10-CM

## 2017-07-06 DIAGNOSIS — F1020 Alcohol dependence, uncomplicated: Secondary | ICD-10-CM | POA: Insufficient documentation

## 2017-07-06 DIAGNOSIS — M2550 Pain in unspecified joint: Secondary | ICD-10-CM | POA: Insufficient documentation

## 2017-07-06 DIAGNOSIS — I10 Essential (primary) hypertension: Secondary | ICD-10-CM | POA: Insufficient documentation

## 2017-07-06 DIAGNOSIS — M2540 Effusion, unspecified joint: Secondary | ICD-10-CM | POA: Insufficient documentation

## 2017-07-06 DIAGNOSIS — Z87898 Personal history of other specified conditions: Secondary | ICD-10-CM

## 2017-07-06 MED ORDER — CLONIDINE HCL 0.1 MG PO TABS: 0.1000 mg | ORAL_TABLET | Freq: Three times a day (TID) | ORAL | 2 refills | Status: AC

## 2017-07-06 MED ORDER — INDOMETHACIN 50 MG PO CAPS
50.0000 mg | ORAL_CAPSULE | Freq: Two times a day (BID) | ORAL | 1 refills | Status: AC
Start: 2017-07-06 — End: ?

## 2017-07-06 MED ORDER — CYANOCOBALAMIN 50 MCG PO TABS: 50.0000 ug | ORAL_TABLET | Freq: Every day | ORAL | 3 refills | Status: AC

## 2017-07-06 MED ORDER — FOLIC ACID 1 MG PO TABS: 1.0000 mg | ORAL_TABLET | Freq: Every day | ORAL | 3 refills | Status: AC

## 2017-07-06 MED ORDER — LISINOPRIL 40 MG PO TABS: 40.0000 mg | ORAL_TABLET | Freq: Every day | ORAL | 2 refills | Status: AC

## 2017-07-06 NOTE — Progress Notes (Signed)
Subjective:  Patient ID: Vincent Price, male    DOB: 29-Oct-1975  Age: 42 y.o. MRN: [id number]  CC: Hypertension   HPI Kalix Meinecke presents for follow up. PMH of HTN, Hep C, fatty liver disease, and alcoholism. He is not exercising and is not adherent to low salt diet. He does not check BP at home. He reports  history of incarceration with recent release. He reports taking his medication differently than prescribed. Cardiac symptoms none. Patient denies chest pain, chest pressure/discomfort, claudication, dyspnea, lower extremity edema, near-syncope, palpitations and syncope.  Cardiovascular risk factors: hypertension, male gender and sedentary lifestyle. Use of agents associated with hypertension: alcohol. He declines speaking with LCSW or referral.   Leg pain/knee pain: Onset 3 days ago. Pain is described aching, cramping. Symptoms worse at night. Associated symptoms include swelling and throbbing. NKI.    Outpatient Medications Prior to Visit  Medication Sig Dispense Refill  . METHADONE HCL PO Take by mouth. Dose unknown    . cloNIDine (CATAPRES) 0.1 MG tablet Take 1 tablet (0.1 mg total) by mouth 3 (three) times daily. 90 tablet 0  . cyanocobalamin 50 MCG tablet Take 1 tablet (50 mcg total) by mouth daily. 90 tablet 3  . folic acid (FOLVITE) 1 MG tablet Take 1 tablet (1 mg total) by mouth daily. 90 tablet 3  . lisinopril (PRINIVIL,ZESTRIL) 40 MG tablet Take 1 tablet (40 mg total) by mouth daily. 90 tablet 0   No facility-administered medications prior to visit.     Review of Systems  Constitutional: Negative.   Respiratory: Negative.   Cardiovascular: Negative.   Gastrointestinal: Negative.   Musculoskeletal: Positive for joint pain and myalgias.  Psychiatric/Behavioral: Positive for substance abuse (alcoholism).    Objective:  BP 137/83 (BP Location: Left Arm, Patient Position: Sitting, Cuff Size: Normal)   Pulse (!) 104   Temp 99.3 F (37.4 C) (Oral)   Resp 18    Ht 6\' 1"  (1.854 m)   Wt 212 lb (96.2 kg)   SpO2 99%   BMI 27.97 kg/m   BP/Weight 07/06/2017 01/29/2017 01/13/2017  Systolic BP 137 115 157  Diastolic BP 83 80 123  Wt. (Lbs) 212 205.4 200.2  BMI 27.97 26.37 25.7   Physical Exam  Nursing note and vitals reviewed. Constitutional: He appears well-developed and well-nourished.  Cardiovascular: Normal rate, regular rhythm, normal heart sounds and intact distal pulses.  Respiratory: Effort normal and breath sounds normal.  GI: Soft. Bowel sounds are normal. There is no tenderness.  Musculoskeletal:       Right knee: He exhibits swelling. Tenderness found.       Left knee: He exhibits swelling. Tenderness found.  Skin: Skin is warm.  Psychiatric: He has a normal mood and affect.    Assessment & Plan:   1. Painful swelling of joint  - indomethacin (INDOCIN) 50 MG capsule; Take 1 capsule (50 mg total) by mouth 2 (two) times daily with a meal.  Dispense: 30 capsule; Refill: 1 - Uric Acid; Future - Rheumatoid factor; Future  2. Nocturnal leg cramps  - POCT ABI Screening Pilot No Charge  3. Hypertension, unspecified type  - lisinopril (PRINIVIL,ZESTRIL) 40 MG tablet; Take 1 tablet (40 mg total) by mouth daily.  Dispense: 30 tablet; Refill: 2 - cloNIDine (CATAPRES) 0.1 MG tablet; Take 1 tablet (0.1 mg total) by mouth 3 (three) times daily.  Dispense: 90 tablet; Refill: 2  4. History of ETOH abuse  - cyanocobalamin 50 MCG tablet; Take 1 tablet (50  mcg total) by mouth daily.  Dispense: 90 tablet; Refill: 3 - folic acid (FOLVITE) 1 MG tablet; Take 1 tablet (1 mg total) by mouth daily.  Dispense: 90 tablet; Refill: 3     Follow-up: Return in about 3 months (around 10/04/2017), or if symptoms worsen or fail to improve, for HTN.   Lizbeth BarkMandesia R Octavis Sheeler FNP

## 2017-07-06 NOTE — Patient Instructions (Signed)
Joint Pain Joint pain, which is also called arthralgia, can be caused by many things. Joint pain often goes away when you follow your health care provider's instructions for relieving pain at home. However, joint pain can also be caused by conditions that require further treatment. Common causes of joint pain include:  Bruising in the area of the joint.  Overuse of the joint.  Wear and tear on the joints that occur with aging (osteoarthritis).  Various other forms of arthritis.  A buildup of a crystal form of uric acid in the joint (gout).  Infections of the joint (septic arthritis) or of the bone (osteomyelitis).  Your health care provider may recommend medicine to help with the pain. If your joint pain continues, additional tests may be needed to diagnose your condition. Follow these instructions at home: Watch your condition for any changes. Follow these instructions as directed to lessen the pain that you are feeling.  Take medicines only as directed by your health care provider.  Rest the affected area for as long as your health care provider says that you should. If directed to do so, raise the painful joint above the level of your heart while you are sitting or lying down.  Do not do things that cause or worsen pain.  If directed, apply ice to the painful area: ? Put ice in a plastic bag. ? Place a towel between your skin and the bag. ? Leave the ice on for 20 minutes, 2-3 times per day.  Wear an elastic bandage, splint, or sling as directed by your health care provider. Loosen the elastic bandage or splint if your fingers or toes become numb and tingle, or if they turn cold and blue.  Begin exercising or stretching the affected area as directed by your health care provider. Ask your health care provider what types of exercise are safe for you.  Keep all follow-up visits as directed by your health care provider. This is important.  Contact a health care provider if:  Your  pain increases, and medicine does not help.  Your joint pain does not improve within 3 days.  You have increased bruising or swelling.  You have a fever.  You lose 10 lb (4.5 kg) or more without trying. Get help right away if:  You are not able to move the joint.  Your fingers or toes become numb or they turn cold and blue. This information is not intended to replace advice given to you by your health care provider. Make sure you discuss any questions you have with your health care provider. Document Released: 06/09/2005 Document Revised: 11/09/2015 Document Reviewed: 03/21/2014 Elsevier Interactive Patient Education  2018 [employer]vier Inc.  

## 2017-07-07 MED FILL — INDOMETHACIN 50 MG CAPSULE: 50 | 15 days supply | Qty: 30 | Fill #0

## 2017-07-07 MED FILL — FOLIC ACID 1 MG TABLET: 1 | 30 days supply | Qty: 30 | Fill #0

## 2017-08-12 MED FILL — LISINOPRIL 40 MG TAB: 40 | 30 days supply | Qty: 30 | Fill #0

## 2017-08-12 MED FILL — INDOMETHACIN 50 MG CAPSULE: 50 | 15 days supply | Qty: 30 | Fill #1

## 2017-08-12 MED FILL — cloNIDine HCL 0.1 MG TABS: 0.1 | 30 days supply | Qty: 90 | Fill #0

## 2017-08-28 LAB — POCT ABI - SCREENING FOR PILOT NO CHARGE: OTHER: NEGATIVE

## 2017-09-10 MED FILL — LISINOPRIL 40 MG TAB: 40 | 30 days supply | Qty: 30 | Fill #1

## 2017-09-10 MED FILL — cloNIDine HCL 0.1 MG TABS: 0.1 | 30 days supply | Qty: 90 | Fill #3

## 2017-10-09 ENCOUNTER — Ambulatory Visit: Payer: Self-pay | Admitting: Internal Medicine

## 2017-10-22 ENCOUNTER — Ambulatory Visit: Payer: Self-pay | Admitting: Internal Medicine

## 2017-11-06 ENCOUNTER — Telehealth: Payer: Self-pay | Admitting: Family Medicine

## 2017-11-06 NOTE — Telephone Encounter (Signed)
Vincent Price called for patient and requested for listed medications to be refilled. [facility] pharmacy  cloNIDine (CATAPRES) 0.1 MG tablet [[id number]]  lisinopril (PRINIVIL,ZESTRIL) 40 MG tablet [[id number]]  Patient has an appointment to [facility] 11-26-17.

## 2017-11-09 MED FILL — LISINOPRIL 40 MG TABLET: 40 | 30 days supply | Qty: 30 | Fill #2

## 2017-11-09 MED FILL — cloNIDine HCL 0.1 MG TABS: 0.1 | 30 days supply | Qty: 90 | Fill #0

## 2017-11-26 ENCOUNTER — Ambulatory Visit: Payer: Self-pay | Admitting: Internal Medicine

## 2018-05-23 IMAGING — US US ABDOMEN LIMITED
1 series · 14 of 25 positions shown · non-contrast
Comparison: None.

CLINICAL DATA: Abdominal pain, nausea and vomiting for 5 days.

EXAM:
US ABDOMEN LIMITED - RIGHT UPPER QUADRANT

[Series 1: us abdomen limited · 0.25mm/px · 14 of 51 slices shown]
[im 1/51]
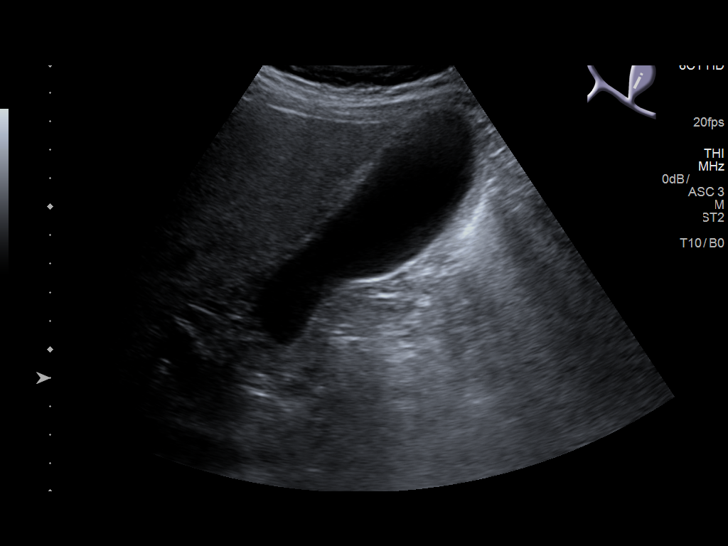
[im 5/51]
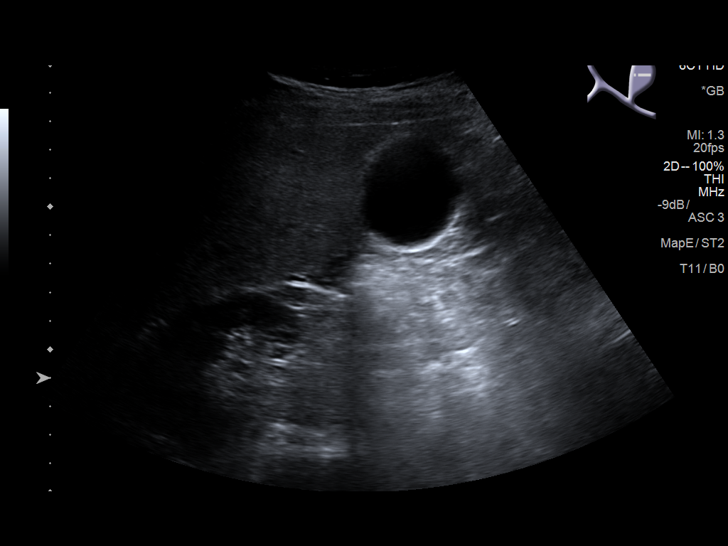
[im 9/51]
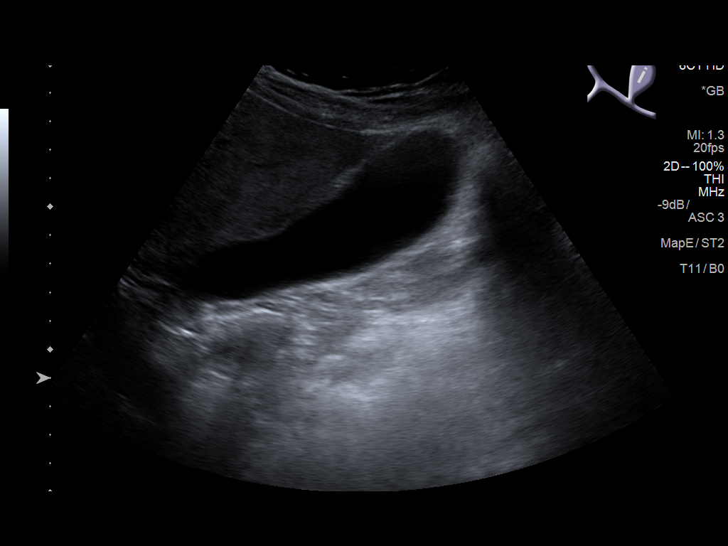
[im 13/51]
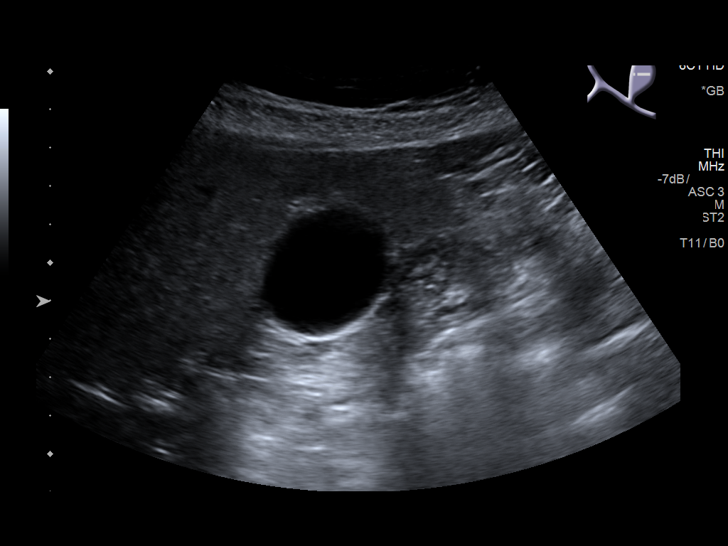
[im 17/51]
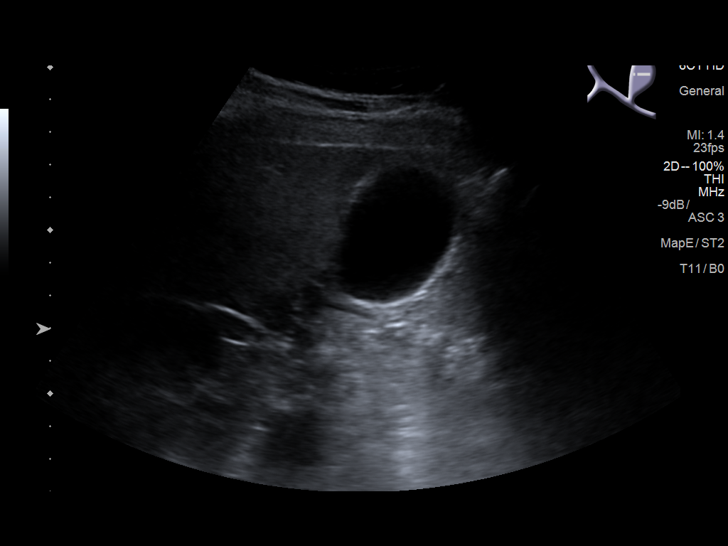
[im 19/51]
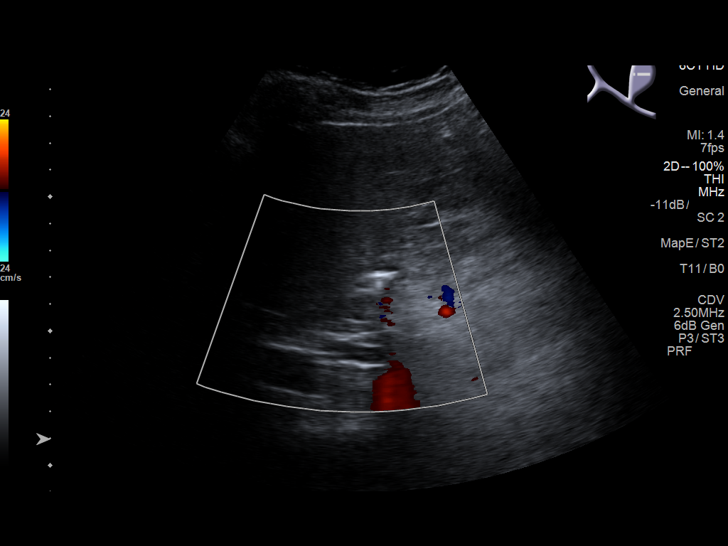
[im 23/51]
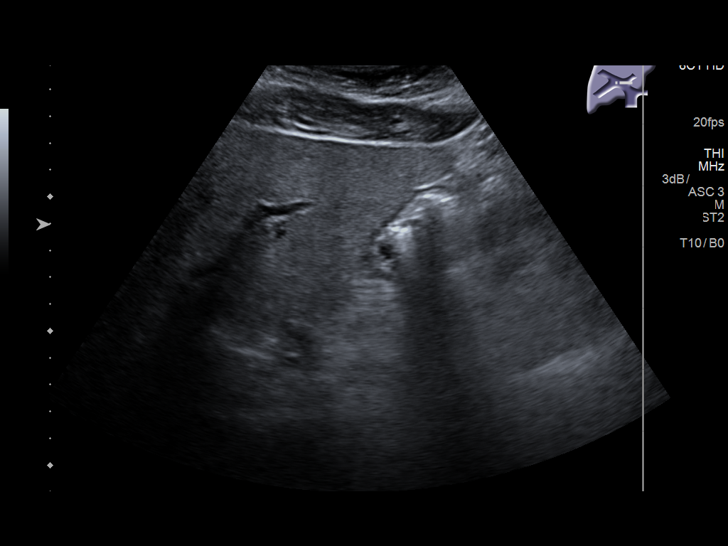
[im 28/51]
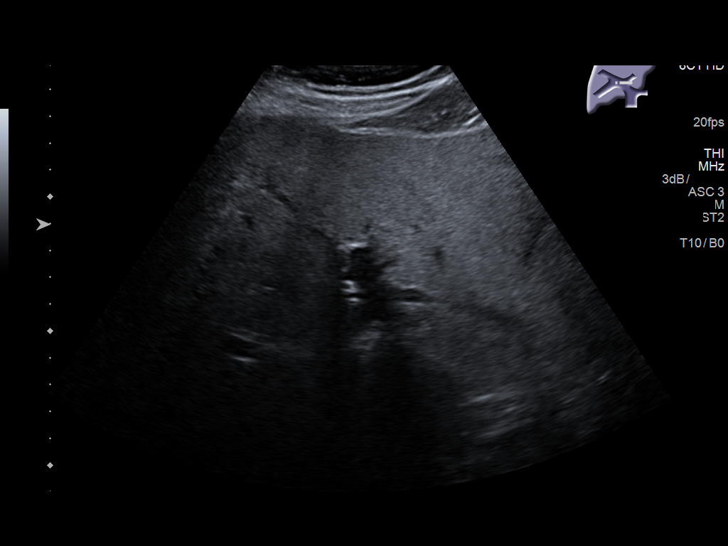
[im 32/51]
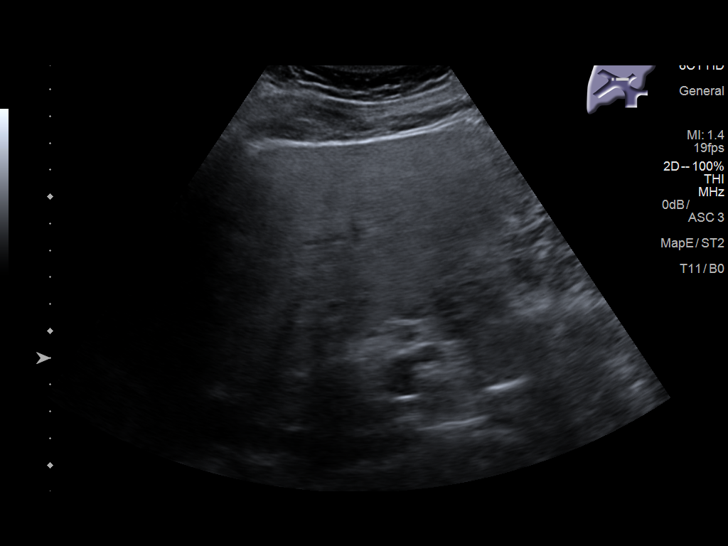
[im 34/51]
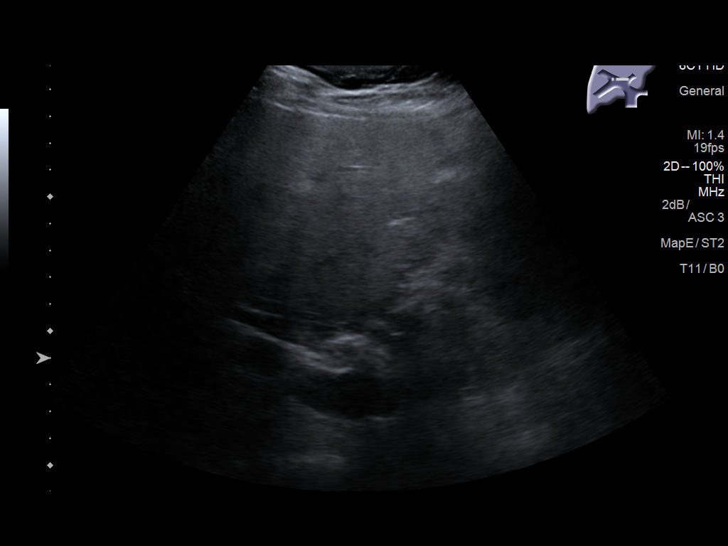
[im 38/51]
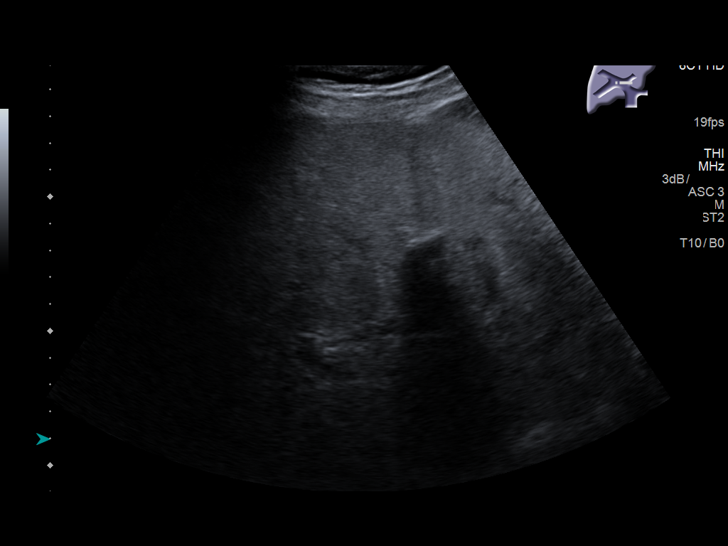
[im 42/51]
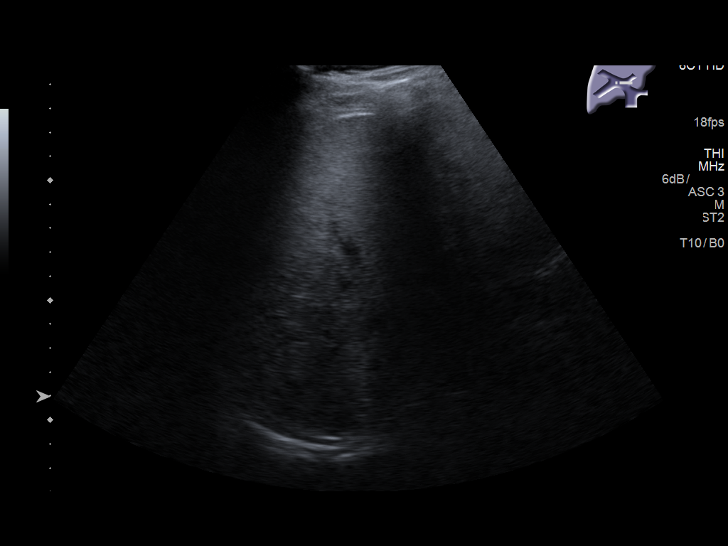
[im 46/51]
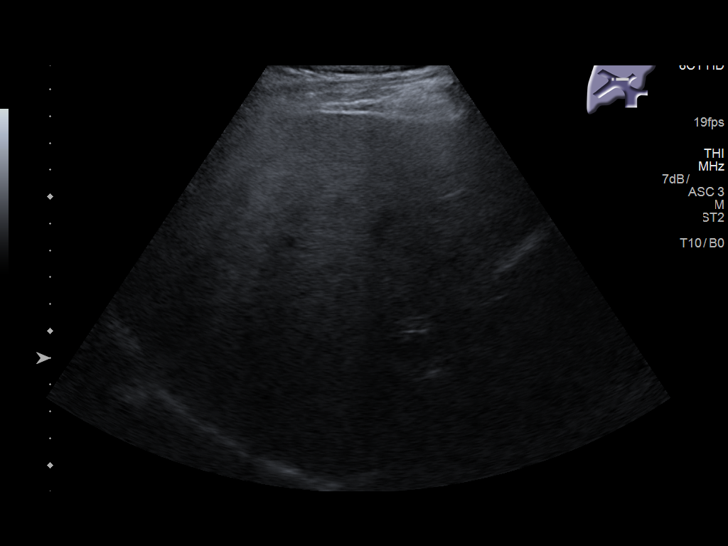
[im 51/51]
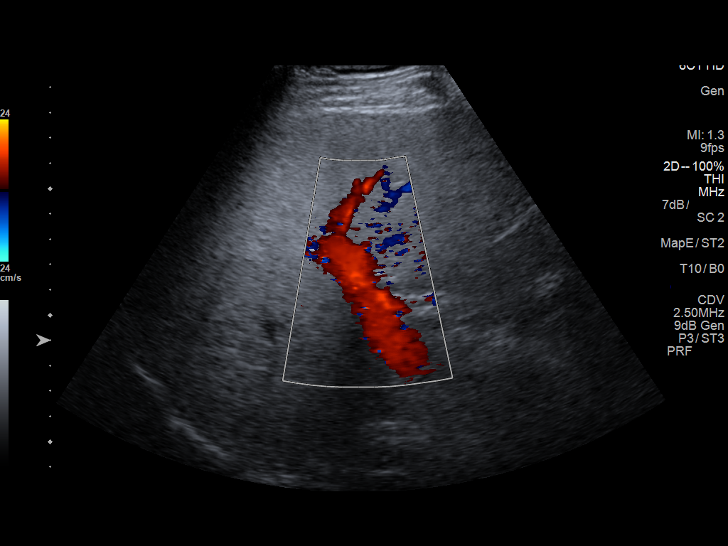

[14 of 25 positions shown; findings below may reference images not displayed]

FINDINGS: Gallbladder:

No gallstones or wall thickening visualized. No sonographic Murphy
sign noted by sonographer.

Common bile duct:

Diameter: 4.8 mm

Liver:

Generalized echogenicity of hepatic parenchyma without focal lesion.
This may represent fatty infiltration.
IMPRESSION: Hepatic steatosis.  Normal gallbladder and bile ducts.

## 2018-10-07 IMAGING — US US ABDOMEN COMPLETE
1 series · 14 of 25 positions shown · non-contrast
Comparison: Ultrasound 05/29/2016

CLINICAL DATA: Epigastric pain, nausea, vomiting for 3 months

EXAM:
ABDOMEN ULTRASOUND COMPLETE

[Series 1: us abdomen complete · 0.22mm/px · 14 of 79 slices shown]
[im 1/79]
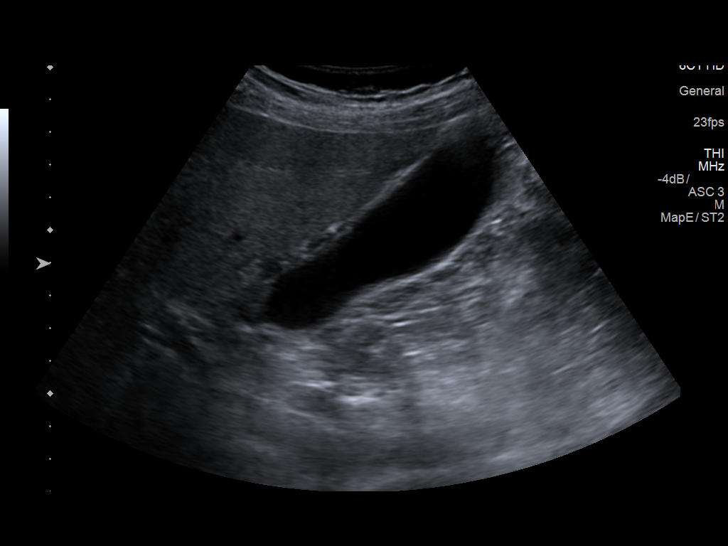
[im 7/79]
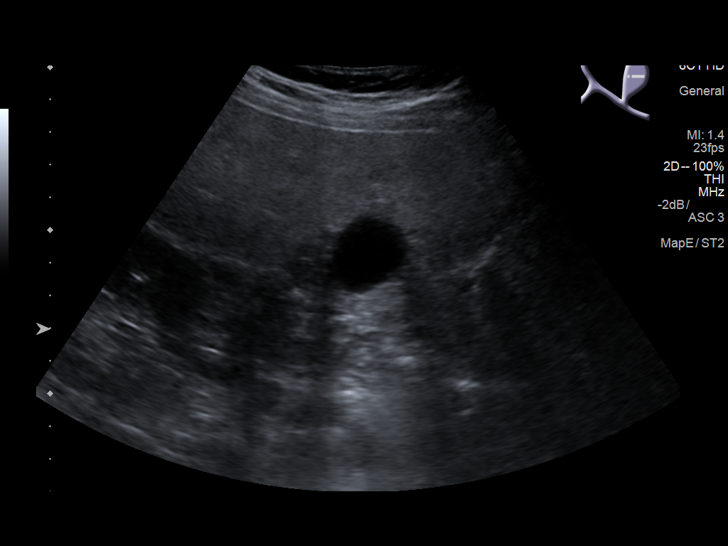
[im 14/79]
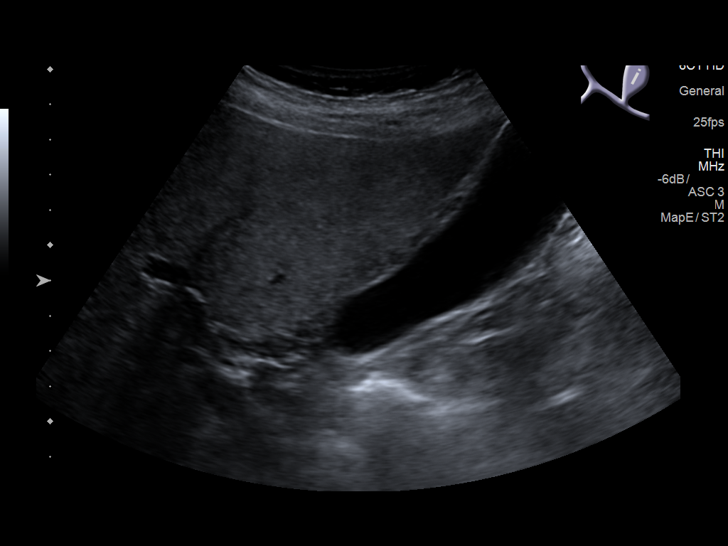
[im 20/79]
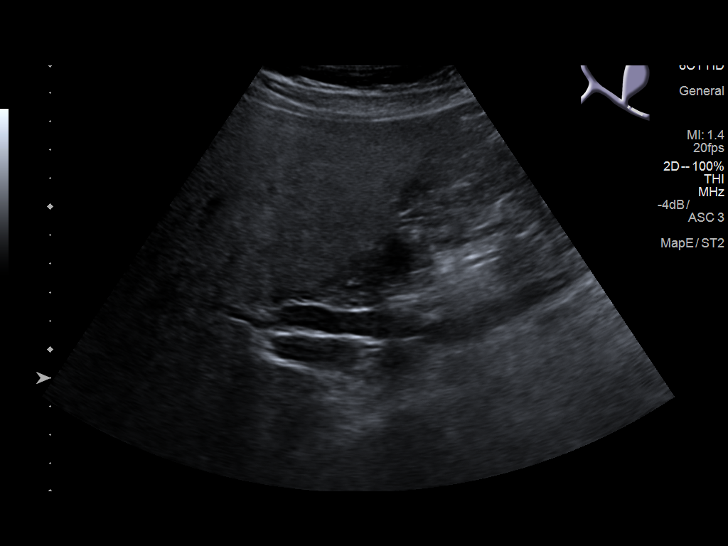
[im 27/79]
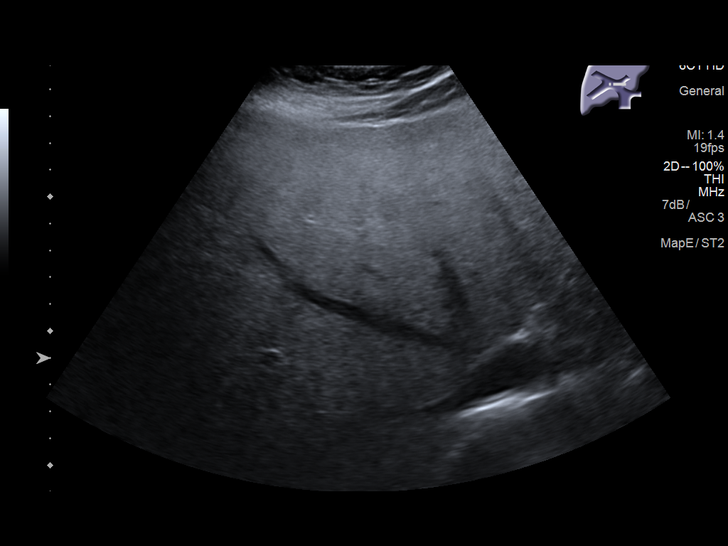
[im 30/79]
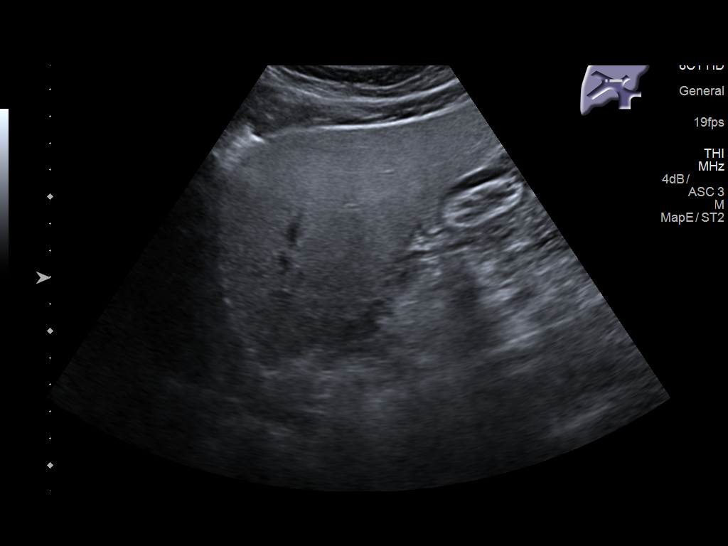
[im 36/79]
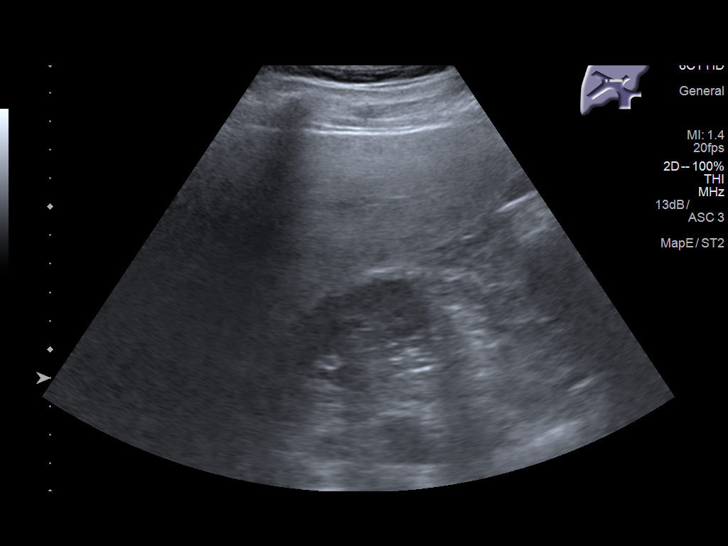
[im 43/79]
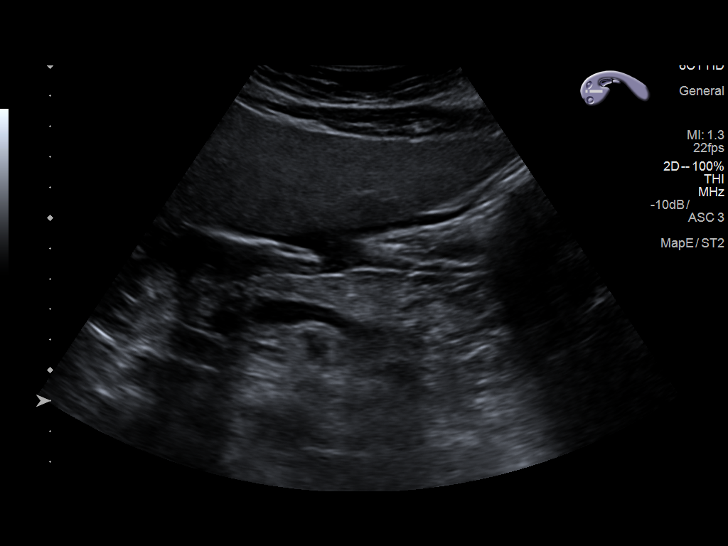
[im 49/79]
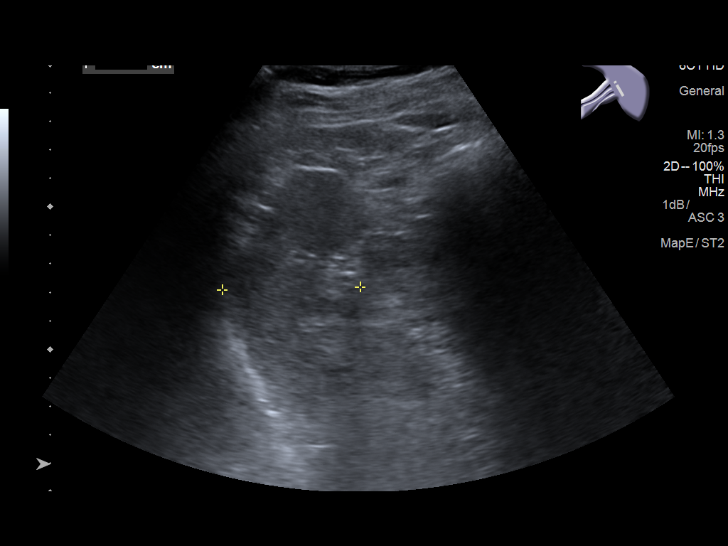
[im 53/79]
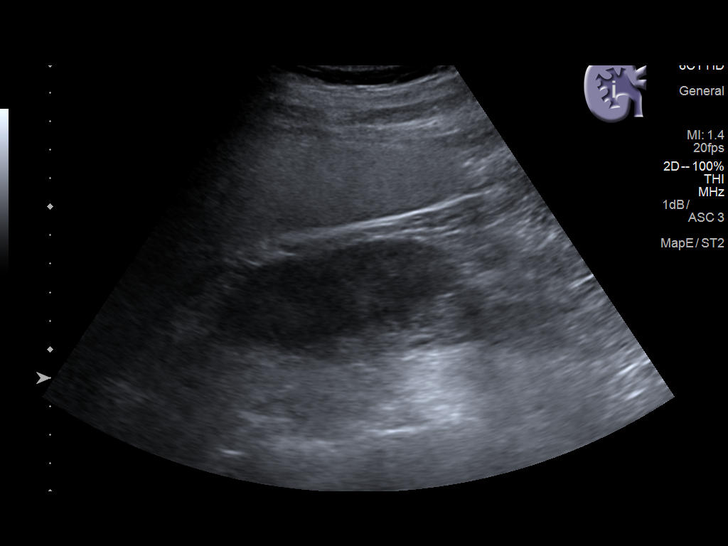
[im 59/79]
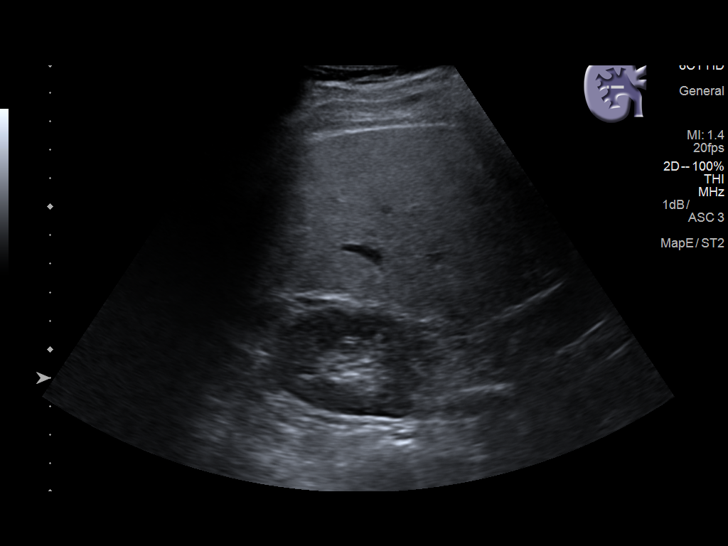
[im 66/79]
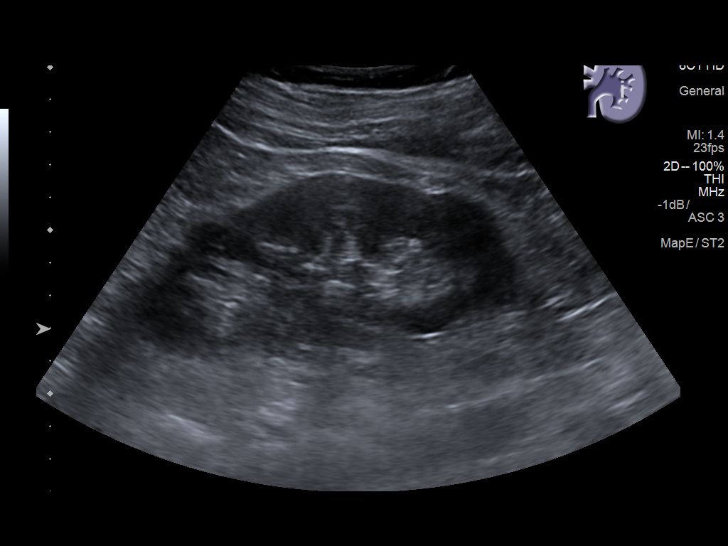
[im 72/79]
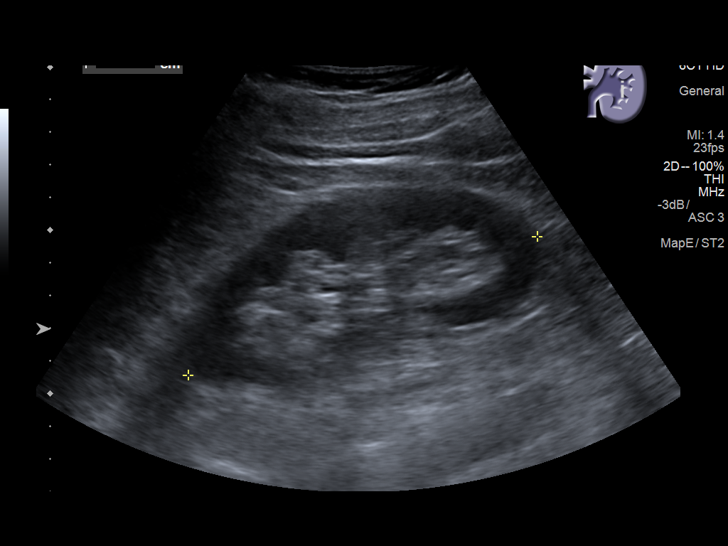
[im 79/79]
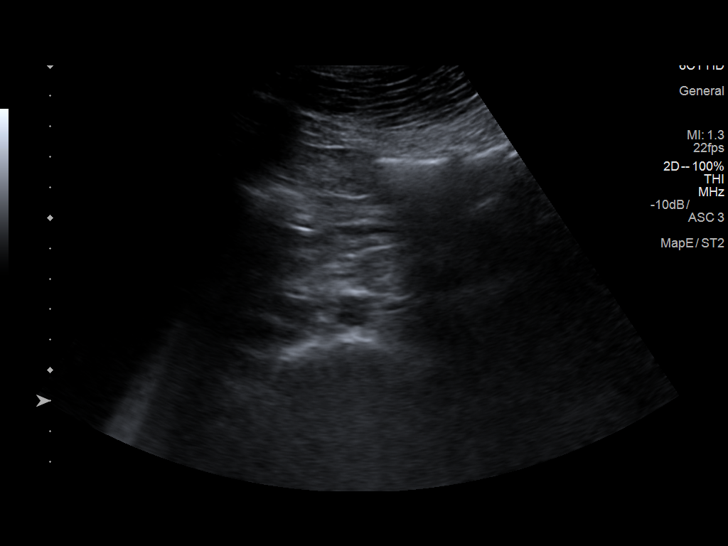

[14 of 25 positions shown; findings below may reference images not displayed]

FINDINGS: Gallbladder: No gallstones or wall thickening visualized. No
sonographic Murphy sign noted by sonographer.

Common bile duct: Diameter: Mildly dilated, 9 mm compared to 5 mm
previously. No visible stone. The distal duct cannot be visualized
due to overlying bowel gas.

Liver: Increased echotexture compatible with fatty infiltration. No
focal abnormality or biliary ductal dilatation.

IVC: No abnormality visualized.

Pancreas: Visualized portion unremarkable.

Spleen: Size and appearance within normal limits.

Right Kidney: Length: 11.9 cm. Echogenicity within normal limits. No
mass or hydronephrosis visualized.

Left Kidney: Length: 11.9 cm. Echogenicity within normal limits. No
mass or hydronephrosis visualized.

Abdominal aorta: No aneurysm visualized.

Other findings: None.
IMPRESSION: Mildly dilated common bile duct. No visible ductal stone, but the
distal duct cannot be visualized due to overlying bowel gas.
Recommend further evaluation with MRCP or ERCP.

Fatty liver.
